# Patient Record
Sex: Female | Born: 1960 | Race: White | Hispanic: No | Marital: Married | Smoking: Current every day smoker
Health system: Southern US, Community
[De-identification: ages and names within clinical notes are randomized; demographics above are authoritative.]

## PROBLEM LIST (undated history)

## (undated) DIAGNOSIS — K219 Gastro-esophageal reflux disease without esophagitis: Secondary | ICD-10-CM

## (undated) DIAGNOSIS — G8929 Other chronic pain: Secondary | ICD-10-CM

## (undated) DIAGNOSIS — E039 Hypothyroidism, unspecified: Secondary | ICD-10-CM

## (undated) DIAGNOSIS — Z86018 Personal history of other benign neoplasm: Secondary | ICD-10-CM

## (undated) DIAGNOSIS — N200 Calculus of kidney: Secondary | ICD-10-CM

## (undated) DIAGNOSIS — N201 Calculus of ureter: Secondary | ICD-10-CM

## (undated) DIAGNOSIS — N950 Postmenopausal bleeding: Secondary | ICD-10-CM

## (undated) DIAGNOSIS — Z8719 Personal history of other diseases of the digestive system: Secondary | ICD-10-CM

## (undated) DIAGNOSIS — Z87442 Personal history of urinary calculi: Secondary | ICD-10-CM

## (undated) DIAGNOSIS — K449 Diaphragmatic hernia without obstruction or gangrene: Secondary | ICD-10-CM

## (undated) DIAGNOSIS — D259 Leiomyoma of uterus, unspecified: Secondary | ICD-10-CM

## (undated) HISTORY — PX: JOINT REPLACEMENT: SHX530

## (undated) HISTORY — PX: TOTAL HIP ARTHROPLASTY: SHX124

## (undated) HISTORY — PX: ABDOMINAL HYSTERECTOMY: SHX81

---

## 1995-12-24 HISTORY — PX: OTHER SURGICAL HISTORY: SHX169

## 1995-12-24 HISTORY — PX: LAPAROTOMY: SHX154

## 1998-09-20 ENCOUNTER — Other Ambulatory Visit: Admission: RE | Admit: 1998-09-20 | Discharge: 1998-09-20 | Payer: Self-pay | Admitting: Obstetrics & Gynecology

## 2001-04-15 ENCOUNTER — Other Ambulatory Visit: Admission: RE | Admit: 2001-04-15 | Discharge: 2001-04-15 | Payer: Self-pay | Admitting: Obstetrics & Gynecology

## 2006-09-04 ENCOUNTER — Observation Stay (HOSPITAL_COMMUNITY): Admission: EM | Admit: 2006-09-04 | Discharge: 2006-09-05 | Payer: Self-pay | Admitting: Emergency Medicine

## 2009-08-31 ENCOUNTER — Encounter: Admission: RE | Admit: 2009-08-31 | Discharge: 2009-08-31 | Payer: Self-pay | Admitting: Obstetrics & Gynecology

## 2009-11-20 ENCOUNTER — Inpatient Hospital Stay (HOSPITAL_COMMUNITY): Admission: RE | Admit: 2009-11-20 | Discharge: 2009-11-22 | Payer: Self-pay | Admitting: Orthopedic Surgery

## 2009-11-20 HISTORY — PX: TOTAL HIP ARTHROPLASTY: SHX124

## 2011-03-26 LAB — BASIC METABOLIC PANEL
BUN: 2 mg/dL — ABNORMAL LOW (ref 6–23)
Calcium: 8.7 mg/dL (ref 8.4–10.5)
Chloride: 105 mEq/L (ref 96–112)
Creatinine, Ser: 0.56 mg/dL (ref 0.4–1.2)
GFR calc non Af Amer: >60 mL/min (ref >60)

## 2011-03-26 LAB — CBC
MCV: 101.6 fL — ABNORMAL HIGH (ref 78.0–100.0)
Platelets: 161 10*3/uL (ref 150–400)
RDW: 12.5 % (ref 11.5–15.5)

## 2011-03-26 LAB — PROTIME-INR: Prothrombin Time: 18 seconds — ABNORMAL HIGH (ref 11.6–15.2)

## 2011-03-27 LAB — BASIC METABOLIC PANEL
BUN: 10 mg/dL (ref 6–23)
Calcium: 8.6 mg/dL (ref 8.4–10.5)
Creatinine, Ser: 0.68 mg/dL (ref 0.4–1.2)
GFR calc Af Amer: >60 mL/min (ref >60)
GFR calc Af Amer: >60 mL/min (ref >60)
GFR calc non Af Amer: >60 mL/min (ref >60)
GFR calc non Af Amer: >60 mL/min (ref >60)
Glucose, Bld: 123 mg/dL — ABNORMAL HIGH (ref 70–99)
Glucose, Bld: 89 mg/dL (ref 70–99)
Potassium: 4.4 mEq/L (ref 3.5–5.1)
Sodium: 135 mEq/L (ref 135–145)

## 2011-03-27 LAB — DIFFERENTIAL
Basophils Absolute: 0.1 10*3/uL (ref 0.0–0.1)
Lymphs Abs: 2.1 10*3/uL (ref 0.7–4.0)
Monocytes Absolute: 0.8 10*3/uL (ref 0.1–1.0)

## 2011-03-27 LAB — CBC
HCT: 42.7 % (ref 36.0–46.0)
Hemoglobin: 12.3 g/dL (ref 12.0–15.0)
Platelets: 204 10*3/uL (ref 150–400)
Platelets: 255 10*3/uL (ref 150–400)
RBC: 3.39 MIL/uL — ABNORMAL LOW (ref 3.87–5.11)
WBC: 10.8 10*3/uL — ABNORMAL HIGH (ref 4.0–10.5)
WBC: 9.6 10*3/uL (ref 4.0–10.5)

## 2011-03-27 LAB — URINALYSIS, ROUTINE W REFLEX MICROSCOPIC
Bilirubin Urine: NEGATIVE
Glucose, UA: NEGATIVE mg/dL
Ketones, ur: NEGATIVE mg/dL
Specific Gravity, Urine: 1.025 (ref 1.005–1.030)

## 2011-03-27 LAB — TYPE AND SCREEN: ABO/RH(D): B POS

## 2011-03-27 LAB — PROTIME-INR
INR: 0.99 (ref 0.00–1.49)
INR: 1.01 (ref 0.00–1.49)
Prothrombin Time: 13.2 seconds (ref 11.6–15.2)

## 2011-03-27 LAB — APTT: aPTT: 24 seconds (ref 24–37)

## 2011-05-10 NOTE — Discharge Summary (Signed)
[name], Kaitlyn Jenkins NO.:  [id number]   MEDICAL RECORD NO.:  [id number]          PATIENT TYPE:  INP   LOCATION:  2019                         FACILITY:  [facility]   PHYSICIAN:  Nicki Guadalajara, M.D.     DATE OF BIRTH:  1961/12/20   DATE OF ADMISSION:  09/04/2006  DATE OF DISCHARGE:  09/05/2006                                 DISCHARGE SUMMARY   DISCHARGE DIAGNOSES:  1. Chest pain.  2. Hypothyroidism.  3. Tobacco abuse.   BRIEF HISTORY:  [name] is a very pleasant 50 year old white female who  presented to [facility] emergency department on September 04, 2006 after she  awakened at 4 a.m. with anterior chest pressure.  The symptoms increased  with movement, walking, bending, twisting, and was improved with rest.  She  states that the pressure radiated from her anterior chest into her neck and  shoulder.  She has not had any associated shortness of breath, nausea, or  diaphoresis.  No lightheadedness, dizziness, syncope, or presyncope.  Please  see dictated history and physical for further details.   HOSPITAL COURSE:  [name] was admitted to [address] with aspirin and beta  blockers.  She ruled out for myocardial infarction with serial cardiac  markers.  Her chest pain has dissipated completely.  Her TSH was mildly  elevated at 6.096.  She is on thyroid replacement.  We have recommended that  she have that rechecked and follow up with her primary care physician if  needed for adjustment of her levothyroxine.  We also had a long discussion  regarding smoking cessation.  She is going to consider this, and we also  discussed Chantix therapy.  She states understanding.  She is stable from a  cardiac standpoint and is deemed ready for discharge.   DISCHARGE INSTRUCTIONS:  She may resume her normal physical activity.  We  recommended that she stop smoking.   DISCHARGE MEDICATIONS:  Include:  1. Aspirin 325 mg daily.  2. __________  daily.  3. Levothyroxine 50 mcg  daily.   We have scheduled her for an outpatient Myoview stress test on September 11, 2006.  She is to be at our office at 8 o'clock in the morning.  She has a  follow up appointment to review the results of that test as well as her  symptoms with Dr. Tresa Endo.  She has been given our office number in case she  has any problems or questions in the meantime she is encouraged to call.     ______________________________  Charmian Muff, NP    ______________________________  Nicki Guadalajara, M.D.    LS/MEDQ  D:  09/05/2006  T:  09/05/2006  Job:  [id number]   cc:   Rodolph Bong, M.D.  [facility]

## 2013-06-22 DIAGNOSIS — N200 Calculus of kidney: Secondary | ICD-10-CM

## 2013-06-22 HISTORY — DX: Calculus of kidney: N20.0

## 2013-06-23 ENCOUNTER — Encounter (HOSPITAL_COMMUNITY): Payer: Self-pay | Admitting: Emergency Medicine

## 2013-06-23 ENCOUNTER — Emergency Department (HOSPITAL_COMMUNITY): Payer: 59

## 2013-06-23 ENCOUNTER — Emergency Department (HOSPITAL_COMMUNITY)
Admission: EM | Admit: 2013-06-23 | Discharge: 2013-06-23 | Disposition: A | Discharge status: Home / Self Care | Payer: 59 | Attending: Emergency Medicine | Admitting: Emergency Medicine

## 2013-06-23 DIAGNOSIS — R112 Nausea with vomiting, unspecified: Secondary | ICD-10-CM | POA: Insufficient documentation

## 2013-06-23 DIAGNOSIS — N201 Calculus of ureter: Secondary | ICD-10-CM | POA: Insufficient documentation

## 2013-06-23 DIAGNOSIS — Z79899 Other long term (current) drug therapy: Secondary | ICD-10-CM | POA: Insufficient documentation

## 2013-06-23 DIAGNOSIS — F17200 Nicotine dependence, unspecified, uncomplicated: Secondary | ICD-10-CM | POA: Insufficient documentation

## 2013-06-23 DIAGNOSIS — N23 Unspecified renal colic: Secondary | ICD-10-CM | POA: Insufficient documentation

## 2013-06-23 LAB — CBC WITH DIFFERENTIAL/PLATELET
Basophils Relative: 1 % (ref 0–1)
Hemoglobin: 16.8 g/dL — ABNORMAL HIGH (ref 12.0–15.0)
MCV: 104 fL — ABNORMAL HIGH (ref 78.0–100.0)
Monocytes Absolute: 0.7 10*3/uL (ref 0.1–1.0)
Neutrophils Relative %: 57 % (ref 43–77)
RBC: 4.45 MIL/uL (ref 3.87–5.11)
WBC: 5.9 10*3/uL (ref 4.0–10.5)

## 2013-06-23 LAB — URINALYSIS, ROUTINE W REFLEX MICROSCOPIC
Bilirubin Urine: NEGATIVE
Glucose, UA: NEGATIVE mg/dL
Leukocytes, UA: NEGATIVE
Protein, ur: NEGATIVE mg/dL
Urobilinogen, UA: 0.2 mg/dL (ref 0.0–1.0)
pH: 5.5 (ref 5.0–8.0)

## 2013-06-23 LAB — COMPREHENSIVE METABOLIC PANEL
AST: 83 U/L — ABNORMAL HIGH (ref 0–37)
Alkaline Phosphatase: 102 U/L (ref 39–117)
BUN: 7 mg/dL (ref 6–23)
CO2: 23 mEq/L (ref 19–32)
Glucose, Bld: 104 mg/dL — ABNORMAL HIGH (ref 70–99)
Sodium: 144 mEq/L (ref 135–145)
Total Protein: 7.1 g/dL (ref 6.0–8.3)

## 2013-06-23 LAB — URINE MICROSCOPIC-ADD ON

## 2013-06-23 MED ORDER — ONDANSETRON HCL 4 MG PO TABS: 4.0000 mg | ORAL_TABLET | Freq: Four times a day (QID) | ORAL | Status: DC

## 2013-06-23 MED ORDER — ONDANSETRON HCL 4 MG/2ML IJ SOLN: 4.0000 mg | INTRAMUSCULAR | Status: DC | PRN

## 2013-06-23 MED ORDER — KETOROLAC TROMETHAMINE 30 MG/ML IJ SOLN
30.0000 mg | Freq: Once | INTRAMUSCULAR | Status: AC
  Administered 2013-06-23: 30 mg via INTRAVENOUS
  Filled 2013-06-23: qty 1

## 2013-06-23 MED ORDER — SODIUM CHLORIDE 0.9 % IV SOLN
INTRAVENOUS | Status: DC
  Administered 2013-06-23: 06:00:00 via INTRAVENOUS

## 2013-06-23 MED ORDER — OXYCODONE-ACETAMINOPHEN 5-325 MG PO TABS: 1.0000 | ORAL_TABLET | Freq: Four times a day (QID) | ORAL | Status: DC | PRN

## 2013-06-23 MED ORDER — FENTANYL CITRATE 0.05 MG/ML IJ SOLN: 50.0000 ug | Freq: Once | INTRAMUSCULAR | Status: AC

## 2013-06-23 MED ORDER — FENTANYL CITRATE 0.05 MG/ML IJ SOLN
INTRAMUSCULAR | Status: AC
  Administered 2013-06-23: 50 ug via INTRAVENOUS
  Filled 2013-06-23: qty 2

## 2013-06-23 MED ORDER — ONDANSETRON HCL 4 MG/2ML IJ SOLN
4.0000 mg | Freq: Once | INTRAMUSCULAR | Status: AC
  Administered 2013-06-23: 4 mg via INTRAVENOUS
  Filled 2013-06-23: qty 2

## 2013-06-23 MED ORDER — TAMSULOSIN HCL 0.4 MG PO CAPS: 0.4000 mg | ORAL_CAPSULE | Freq: Every day | ORAL | Status: DC

## 2013-06-23 MED ORDER — HYDROMORPHONE HCL PF 1 MG/ML IJ SOLN
1.0000 mg | Freq: Once | INTRAMUSCULAR | Status: AC
  Administered 2013-06-23: 1 mg via INTRAVENOUS
  Filled 2013-06-23: qty 1

## 2013-06-23 MED ORDER — MORPHINE SULFATE 4 MG/ML IJ SOLN
4.0000 mg | INTRAMUSCULAR | Status: DC | PRN
  Administered 2013-06-23: 4 mg via INTRAVENOUS
  Filled 2013-06-23: qty 1

## 2013-06-23 NOTE — ED Notes (Signed)
PT. REPORTS RIGHT LATERAL ABDOMINAL PAIN WITH VOMITTING THIS MORNING , DENIES DIARRHEA/FEVER OR CHILLS. NO URINARY SYMPTOMS.

## 2013-06-23 NOTE — ED Notes (Signed)
Pt and husband stated sudden of right abdominal pain started at 0230 woken up from sleep with the pain, rating it 10/10 and radiating now to back.

## 2013-06-23 NOTE — ED Notes (Signed)
Pt discharged.Vital signs stable and GCS 15 

## 2013-06-23 NOTE — ED Notes (Signed)
MD at bedside. 

## 2013-06-23 NOTE — ED Provider Notes (Signed)
History    CSN: [id number] Arrival date & time 06/23/13  0435  First MD Initiated Contact with Patient 06/23/13 0522     Chief Complaint  Patient presents with  . Abdominal Pain    HPI Pt was seen at 0520.  Per pt, c/o sudden onset and persistence of waxing and waning right sided flank "pain" that began this morning approximately 0230 PTA.  Pt describes the pain as "sharp," and radiating into the right side of her abd.  Has been associated with multiple intermittent episodes of N/V.  Denies dysuria/hematuria, no abd pain, no diarrhea, no black or blood in emesis, no CP/SOB, no fevers, no rash.    History reviewed. No pertinent past medical history.  Past Surgical History  Procedure Laterality Date  . Total hip arthroplasty      History  Substance Use Topics  . Smoking status: Current Every Day Smoker  . Smokeless tobacco: Not on file  . Alcohol Use: Yes    Review of Systems ROS: Statement: All systems negative except as marked or noted in the HPI; Constitutional: Negative for fever and chills. ; ; Eyes: Negative for eye pain, redness and discharge. ; ; ENMT: Negative for ear pain, hoarseness, nasal congestion, sinus pressure and sore throat. ; ; Cardiovascular: Negative for chest pain, palpitations, diaphoresis, dyspnea and peripheral edema. ; ; Respiratory: Negative for cough, wheezing and stridor. ; ; Gastrointestinal: +N/V. Negative for diarrhea, abdominal pain, blood in stool, hematemesis, jaundice and rectal bleeding. . ; ; Genitourinary: +flank pain. Negative for dysuria and hematuria. ; ; Musculoskeletal: Negative for back pain and neck pain. Negative for swelling and trauma.; ; Skin: Negative for pruritus, rash, abrasions, blisters, bruising and skin lesion.; ; Neuro: Negative for headache, lightheadedness and neck stiffness. Negative for weakness, altered level of consciousness , altered mental status, extremity weakness, paresthesias, involuntary movement, seizure and  syncope.     Allergies  Review of patient's allergies indicates no known allergies.  Home Medications   Current Outpatient Rx  Name  Route  Sig  Dispense  Refill  . CALCIUM PO   Oral   Take 1 tablet by mouth daily.         . Cholecalciferol (VITAMIN D PO)   Oral   Take 1 tablet by mouth daily.         Marland Kitchen esomeprazole (NEXIUM) 40 MG capsule   Oral   Take 40 mg by mouth daily before breakfast.         . levothyroxine (SYNTHROID, LEVOTHROID) 100 MCG tablet   Oral   Take 100 mcg by mouth daily before breakfast.         . Multiple Vitamin (MULTIVITAMIN WITH MINERALS) TABS   Oral   Take 1 tablet by mouth daily.         . Omega-3 Fatty Acids (OMEGA 3 PO)   Oral   Take 1 tablet by mouth daily.         . phentermine (ADIPEX-P) 37.5 MG tablet   Oral   Take 37.5 mg by mouth daily before breakfast.          BP 147/78  Pulse 72  Temp(Src) 97.9 F (36.6 C) (Oral)  Resp 20  SpO2 99%  Physical Exam 0525: Physical examination:  Nursing notes reviewed; Vital signs and O2 SAT reviewed;  Constitutional: Well developed, Well nourished, Well hydrated, Uncomfortable appearing; Head:  Normocephalic, atraumatic; Eyes: EOMI, PERRL, No scleral icterus; ENMT: Mouth and pharynx normal, Mucous membranes moist;  Neck: Supple, Full range of motion, No lymphadenopathy; Cardiovascular: Regular rate and rhythm, No gallop; Respiratory: Breath sounds clear & equal bilaterally, No rales, rhonchi, wheezes.  Speaking full sentences with ease, Normal respiratory effort/excursion; Chest: Nontender, Movement normal; Abdomen: Soft, Nontender, Nondistended, Normal bowel sounds; Genitourinary: No CVA tenderness; Spine:  No midline CS, TS, LS tenderness.;; Extremities: Pulses normal, No tenderness, No edema, No calf edema or asymmetry.; Neuro: AA&Ox3, Major CN grossly intact.  Speech clear. No gross focal motor or sensory deficits in extremities.; Skin: Color normal, Warm, Dry.   ED Course   Procedures     MDM  MDM Reviewed: previous chart, nursing note and vitals Reviewed previous: labs Interpretation: labs and CT scan   Results for orders placed during the hospital encounter of 06/23/13  URINALYSIS, ROUTINE W REFLEX MICROSCOPIC      Result Value Range   Color, Urine YELLOW  YELLOW   APPearance CLOUDY (*) CLEAR   Specific Gravity, Urine 1.019  1.005 - 1.030   pH 5.5  5.0 - 8.0   Glucose, UA NEGATIVE  NEGATIVE mg/dL   Hgb urine dipstick LARGE (*) NEGATIVE   Bilirubin Urine NEGATIVE  NEGATIVE   Ketones, ur NEGATIVE  NEGATIVE mg/dL   Protein, ur NEGATIVE  NEGATIVE mg/dL   Urobilinogen, UA 0.2  0.0 - 1.0 mg/dL   Nitrite NEGATIVE  NEGATIVE   Leukocytes, UA NEGATIVE  NEGATIVE  CBC WITH DIFFERENTIAL      Result Value Range   WBC 5.9  4.0 - 10.5 K/uL   RBC 4.45  3.87 - 5.11 MIL/uL   Hemoglobin 16.8 (*) 12.0 - 15.0 g/dL   HCT 16.1 (*) 09.6 - 04.5 %   MCV 104.0 (*) 78.0 - 100.0 fL   MCH 37.8 (*) 26.0 - 34.0 pg   MCHC 36.3 (*) 30.0 - 36.0 g/dL   RDW 40.9  81.1 - 91.4 %   Platelets 192  150 - 400 K/uL   Neutrophils Relative % 57  43 - 77 %   Neutro Abs 3.4  1.7 - 7.7 K/uL   Lymphocytes Relative 29  12 - 46 %   Lymphs Abs 1.7  0.7 - 4.0 K/uL   Monocytes Relative 11  3 - 12 %   Monocytes Absolute 0.7  0.1 - 1.0 K/uL   Eosinophils Relative 2  0 - 5 %   Eosinophils Absolute 0.1  0.0 - 0.7 K/uL   Basophils Relative 1  0 - 1 %   Basophils Absolute 0.0  0.0 - 0.1 K/uL  COMPREHENSIVE METABOLIC PANEL      Result Value Range   Sodium 144  135 - 145 mEq/L   Potassium 3.6  3.5 - 5.1 mEq/L   Chloride 105  96 - 112 mEq/L   CO2 23  19 - 32 mEq/L   Glucose, Bld 104 (*) 70 - 99 mg/dL   BUN 7  6 - 23 mg/dL   Creatinine, Ser 7.82  0.50 - 1.10 mg/dL   Calcium 9.5  8.4 - 95.6 mg/dL   Total Protein 7.1  6.0 - 8.3 g/dL   Albumin 3.6  3.5 - 5.2 g/dL   AST 83 (*) 0 - 37 U/L   ALT 104 (*) 0 - 35 U/L   Alkaline Phosphatase 102  39 - 117 U/L   Total Bilirubin 0.7  0.3 - 1.2  mg/dL   GFR calc non Af Amer >90  >90 mL/min   GFR calc Af Amer >90  >90 mL/min  URINE MICROSCOPIC-ADD ON      Result Value Range   Squamous Epithelial / LPF MANY (*) RARE   WBC, UA 0-2  <3 WBC/hpf   RBC / HPF 7-10  <3 RBC/hpf   Bacteria, UA RARE  RARE  LIPASE, BLOOD      Result Value Range   Lipase 35  11 - 59 U/L   Ct Abdomen Pelvis Wo Contrast 06/23/2013   *RADIOLOGY REPORT*  Clinical Data: Right-sided abdominal pain radiating to the back.  CT ABDOMEN AND PELVIS WITHOUT CONTRAST  Technique:  Multidetector CT imaging of the abdomen and pelvis was performed following the standard protocol without intravenous contrast.  Comparison: None.  Findings: Lung Bases:  Dependent atelectasis.  Liver:  Fatty liver.  There is a focal area of increased attenuation in the left hepatic lobe measuring 2 cm (image number 19 series 2). This may represent an area of focal fatty sparing. No other abnormalities.  Spleen:  Normal.  Gallbladder:  Normal.  No calcified stones.  Common bile duct:  Normal.  Pancreas:  Normal.  Adrenal glands:  Right adrenal adenomas and thickening of the left adrenal gland without discrete lesion.  Kidneys:  Nonobstructing left upper pole renal collecting system calculus measuring 6 mm long axis.  Mild to moderate right hydroureteronephrosis.  There is a right UPJ stone measuring 8 mm by 5 mm.  Smaller nonobstructing collecting system calculi are present in the right inferior renal pole.  Mild right perinephric and proximal periureteric stranding.  The distal third of the right ureter is obscured by artifact from right hip arthroplasty.  Left ureter appears within normal limits.  Stomach:  Grossly normal.  Small bowel:  Grossly normal.  Colon:   Normal appendix.  No inflammatory changes colon.  Distal colon decompressed.  Pelvic Genitourinary:  Visualized portions appear within normal limits.  The pelvis partially obscured by artifact from hip arthroplasty.  Bones:  No aggressive osseous  lesions.  Chronic-appearing L2 superior endplate compression fracture with mild retropulsion and associated disc osteophyte complex.  Vasculature: Atherosclerosis without acute vascular abnormality.  Body Wall: Normal.  IMPRESSION:  1.  8 mm x 5 mm right UPJ stone with mild to moderate right hydronephrosis. 2.  Fatty liver.  Higher attenuation 2 cm lesion in the anterior left hepatic lobe may represent an area of focal fatty sparing. Follow up liver MRI recommended for further assessment. 3.  Right adrenal adenomas. 4.  Bilateral residual nonobstructing renal collecting system calculi.   Original Report Authenticated By: Andreas Newport, M.D.    Meryl.Garbe:  +ureteral calculi on CT scan. Pt's pain intermittently controlled with meds, then returns. LFT's mildly elevated, no old to compare. No UTI and BUN/Cr normal. Will re-dose pain meds.  Sign out to Dr. Anitra Lauth.   Laray Anger, DO 06/23/13 [phone]

## 2013-06-23 NOTE — ED Provider Notes (Signed)
Patient's pain is resolved after Dilaudid and Toradol. We'll discharge patient home with Flomax, Percocet and Zofran. She was given number for urology for followup in given strict return precautions.  Gwyneth Sprout, MD 06/23/13 [phone]

## 2013-06-28 ENCOUNTER — Other Ambulatory Visit: Payer: Self-pay | Admitting: Urology

## 2013-06-29 ENCOUNTER — Encounter (HOSPITAL_COMMUNITY): Payer: Self-pay | Admitting: *Deleted

## 2013-06-30 ENCOUNTER — Encounter (HOSPITAL_COMMUNITY): Payer: Self-pay | Admitting: Pharmacy Technician

## 2013-07-01 ENCOUNTER — Ambulatory Visit (HOSPITAL_COMMUNITY)
Admission: RE | Admit: 2013-07-01 | Discharge: 2013-07-01 | Disposition: A | Discharge status: Home / Self Care | Payer: 59 | Source: Ambulatory Visit | Attending: Urology | Admitting: Urology

## 2013-07-01 ENCOUNTER — Encounter (HOSPITAL_COMMUNITY): Payer: Self-pay | Admitting: *Deleted

## 2013-07-01 ENCOUNTER — Ambulatory Visit (HOSPITAL_COMMUNITY): Payer: 59

## 2013-07-01 ENCOUNTER — Encounter (HOSPITAL_COMMUNITY)
Admission: RE | Disposition: A | Discharge status: Home / Self Care | Payer: Self-pay | Source: Ambulatory Visit | Attending: Urology

## 2013-07-01 DIAGNOSIS — N200 Calculus of kidney: Secondary | ICD-10-CM | POA: Insufficient documentation

## 2013-07-01 DIAGNOSIS — K219 Gastro-esophageal reflux disease without esophagitis: Secondary | ICD-10-CM | POA: Insufficient documentation

## 2013-07-01 DIAGNOSIS — N201 Calculus of ureter: Secondary | ICD-10-CM

## 2013-07-01 DIAGNOSIS — E039 Hypothyroidism, unspecified: Secondary | ICD-10-CM | POA: Insufficient documentation

## 2013-07-01 DIAGNOSIS — N1330 Unspecified hydronephrosis: Secondary | ICD-10-CM | POA: Insufficient documentation

## 2013-07-01 HISTORY — DX: Calculus of kidney: N20.0

## 2013-07-01 HISTORY — DX: Gastro-esophageal reflux disease without esophagitis: K21.9

## 2013-07-01 HISTORY — DX: Hypothyroidism, unspecified: E03.9

## 2013-07-01 HISTORY — DX: Personal history of other diseases of the digestive system: Z87.19

## 2013-07-01 SURGERY — LITHOTRIPSY, ESWL
Anesthesia: LOCAL | Laterality: Right

## 2013-07-01 MED ORDER — DEXTROSE-NACL 5-0.45 % IV SOLN
INTRAVENOUS | Status: DC
  Administered 2013-07-01: 12:00:00 via INTRAVENOUS

## 2013-07-01 MED ORDER — OXYCODONE-ACETAMINOPHEN 5-325 MG PO TABS: 1.0000 | ORAL_TABLET | ORAL | Status: DC | PRN

## 2013-07-01 MED ORDER — ONDANSETRON HCL 4 MG/2ML IJ SOLN: 4.0000 mg | INTRAMUSCULAR | Status: DC | PRN

## 2013-07-01 MED ORDER — CIPROFLOXACIN HCL 500 MG PO TABS
500.0000 mg | ORAL_TABLET | ORAL | Status: AC
  Administered 2013-07-01: 500 mg via ORAL
  Filled 2013-07-01: qty 1

## 2013-07-01 MED ORDER — DIAZEPAM 5 MG PO TABS
10.0000 mg | ORAL_TABLET | ORAL | Status: AC
  Administered 2013-07-01: 10 mg via ORAL
  Filled 2013-07-01: qty 2

## 2013-07-01 MED ORDER — DIPHENHYDRAMINE HCL 25 MG PO CAPS
25.0000 mg | ORAL_CAPSULE | ORAL | Status: AC
  Administered 2013-07-01: 25 mg via ORAL
  Filled 2013-07-01: qty 1

## 2013-07-01 MED ORDER — SENNOSIDES-DOCUSATE SODIUM 8.6-50 MG PO TABS: 1.0000 | ORAL_TABLET | Freq: Two times a day (BID) | ORAL | Status: DC

## 2013-07-01 MED ORDER — MORPHINE SULFATE 10 MG/ML IJ SOLN: 2.0000 mg | INTRAMUSCULAR | Status: DC | PRN

## 2013-07-01 NOTE — Brief Op Note (Signed)
07/01/2013  1:36 PM  PATIENT:  Kaitlyn Jenkins  52 y.o. female  PRE-OPERATIVE DIAGNOSIS:  RIGHT URETEROPELVIC JUNCTION STONE  POST-OPERATIVE DIAGNOSIS:  Same  PROCEDURE:  Procedure(s): RIGHT EXTRACORPOREAL SHOCK WAVE LITHOTRIPSY (ESWL) (Right)  SURGEON:  Surgeon(s) and Role:    * Milford Cage, MD - Primary  PHYSICIAN ASSISTANT:   ASSISTANTS: none   ANESTHESIA:   IV sedation  EBL:   N/a  BLOOD ADMINISTERED:none  DRAINS: none   LOCAL MEDICATIONS USED:  NONE  FINDINGS: Small 1.5 cm superficial abrasion/hematoma on right flank where skin was coupled to SWL device.  SPECIMEN:  No Specimen  DISPOSITION OF SPECIMEN:  N/A  COUNTS:  YES  TOURNIQUET:  * No tourniquets in log *  DICTATION: .Note written in paper chart  PLAN OF CARE: Discharge to home after PACU  PATIENT DISPOSITION:  PACU - hemodynamically stable.   Delay start of Pharmacological VTE agent (>24hrs) due to surgical blood loss or risk of bleeding: yes

## 2013-07-01 NOTE — H&P (Signed)
Urology History and Physical Exam  CC: Right ureteropelvic junction stone.  HPI:  52 year old female today presents for a right ureter stone. This is her first stone event. She began having symptoms on 06/23/13. A CT scan on that same day revealed a right-sided ureteropelvic junction stone. It measured 8 x 5 mm in size. It was associated with proximal hydronephrosis. There was also nephrolithiasis bilaterally. It is not associated with fever. She has had emesis and nausea. This was able to be controlled with oral medications. She had a KUB on 06/24/13 in which the right proximal ureter stone was visible near the transverse process on the right side of L2. After reviewing options she has elected for shockwave lithotripsy appeared she presents today for that procedure. We have discussed the risks, benefits, side effects and alternatives. UA 06/24/13 was negative for signs of infection.  PMH: Past Medical History  Diagnosis Date  . Hypothyroidism   . Renal stone 06/2013  . GERD (gastroesophageal reflux disease)   . H/O hiatal hernia     PSH: Past Surgical History  Procedure Laterality Date  . Total hip arthroplasty      Allergies: No Known Allergies  Medications: No prescriptions prior to admission     Social History: History   Social History  . Marital Status: Married    Spouse Name: N/A    Number of Children: N/A  . Years of Education: N/A   Occupational History  . Not on file.   Social History Main Topics  . Smoking status: Current Every Day Smoker -- 1.00 packs/day for 38 years    Types: Cigarettes  . Smokeless tobacco: Not on file  . Alcohol Use: 2.4 oz/week    4 Cans of beer per week     Comment: 4/week  . Drug Use: No  . Sexually Active: Not on file   Other Topics Concern  . Not on file   Social History Narrative  . No narrative on file    Family History: History reviewed. No pertinent family history.  Review of Systems: Positive: Right flank  pain. Negative: Fever, chest pain, or SOB.  A further 10 point review of systems was negative except what is listed in the HPI.  Physical Exam: Filed Vitals:   07/01/13 1030  BP: 152/76  Pulse: 91  Temp: 98.5 F (36.9 C)  Resp: 18    General: No acute distress.  Awake. Head:  Normocephalic.  Atraumatic. ENT:  EOMI.  Mucous membranes moist Neck:  Supple.  No lymphadenopathy. CV:  S1 present. S2 present. Regular rate. Pulmonary: Equal effort bilaterally.  Clear to auscultation bilaterally. Abdomen: Soft.  Non- tender to palpation. Skin:  Normal turgor.  No visible rash. Extremity: No gross deformity of bilateral upper extremities.  No gross deformity of    bilateral lower extremities. Neurologic: Alert. Appropriate mood.    Studies:  No results found for this basename: HGB, WBC, PLT,  in the last 72 hours  No results found for this basename: NA, K, CL, CO2, BUN, CREATININE, CALCIUM, MAGNESIUM, GFRNONAA, GFRAA,  in the last 72 hours   No results found for this basename: PT, INR, APTT,  in the last 72 hours   No components found with this basename: ABG,     Assessment:  Right ureteropelvic junction stone.  Plan: Proceed with shockwave lithotripsy of the right ureteropelvic junction stone.

## 2013-07-02 HISTORY — PX: EXTRACORPOREAL SHOCK WAVE LITHOTRIPSY: SHX1557

## 2014-08-09 ENCOUNTER — Encounter (HOSPITAL_COMMUNITY): Payer: Self-pay | Admitting: Emergency Medicine

## 2014-08-09 ENCOUNTER — Emergency Department (HOSPITAL_COMMUNITY): Payer: 59

## 2014-08-09 ENCOUNTER — Emergency Department (HOSPITAL_COMMUNITY)
Admission: EM | Admit: 2014-08-09 | Discharge: 2014-08-09 | Discharge status: Against Medical Advice | Payer: 59 | Attending: Emergency Medicine | Admitting: Emergency Medicine

## 2014-08-09 DIAGNOSIS — Z87442 Personal history of urinary calculi: Secondary | ICD-10-CM | POA: Insufficient documentation

## 2014-08-09 DIAGNOSIS — Z79899 Other long term (current) drug therapy: Secondary | ICD-10-CM | POA: Insufficient documentation

## 2014-08-09 DIAGNOSIS — F17200 Nicotine dependence, unspecified, uncomplicated: Secondary | ICD-10-CM | POA: Diagnosis not present

## 2014-08-09 DIAGNOSIS — E039 Hypothyroidism, unspecified: Secondary | ICD-10-CM | POA: Insufficient documentation

## 2014-08-09 DIAGNOSIS — M549 Dorsalgia, unspecified: Secondary | ICD-10-CM | POA: Insufficient documentation

## 2014-08-09 DIAGNOSIS — R079 Chest pain, unspecified: Secondary | ICD-10-CM | POA: Insufficient documentation

## 2014-08-09 DIAGNOSIS — R1013 Epigastric pain: Secondary | ICD-10-CM | POA: Insufficient documentation

## 2014-08-09 DIAGNOSIS — K219 Gastro-esophageal reflux disease without esophagitis: Secondary | ICD-10-CM | POA: Insufficient documentation

## 2014-08-09 LAB — CBC
HCT: 41 % (ref 36.0–46.0)
Hemoglobin: 14.5 g/dL (ref 12.0–15.0)
MCH: 36.8 pg — ABNORMAL HIGH (ref 26.0–34.0)
MCHC: 35.4 g/dL (ref 30.0–36.0)
MCV: 104.1 fL — AB (ref 78.0–100.0)
PLATELETS: 201 10*3/uL (ref 150–400)
RBC: 3.94 MIL/uL (ref 3.87–5.11)
RDW: 12.2 % (ref 11.5–15.5)
WBC: 6.1 10*3/uL (ref 4.0–10.5)

## 2014-08-09 LAB — COMPREHENSIVE METABOLIC PANEL
ALBUMIN: 3.9 g/dL (ref 3.5–5.2)
ALK PHOS: 111 U/L (ref 39–117)
ALT: 85 U/L — ABNORMAL HIGH (ref 0–35)
AST: 66 U/L — ABNORMAL HIGH (ref 0–37)
Anion gap: 15 (ref 5–15)
BUN: 7 mg/dL (ref 6–23)
CO2: 23 mEq/L (ref 19–32)
Calcium: 9.7 mg/dL (ref 8.4–10.5)
Chloride: 101 mEq/L (ref 96–112)
Creatinine, Ser: 0.58 mg/dL (ref 0.50–1.10)
GFR calc Af Amer: >90 mL/min (ref >90)
GFR calc non Af Amer: >90 mL/min (ref >90)
GLUCOSE: 116 mg/dL — AB (ref 70–99)
POTASSIUM: 3.7 meq/L (ref 3.7–5.3)
Sodium: 139 mEq/L (ref 137–147)
TOTAL PROTEIN: 7.1 g/dL (ref 6.0–8.3)
Total Bilirubin: 0.5 mg/dL (ref 0.3–1.2)

## 2014-08-09 LAB — I-STAT TROPONIN, ED: TROPONIN I, POC: 0 ng/mL (ref 0.00–0.08)

## 2014-08-09 NOTE — ED Provider Notes (Signed)
CSN: [id number]     Arrival date & time 08/09/14  1629 History   First MD Initiated Contact with Patient 08/09/14 1656     Chief Complaint  Patient presents with  . Chest Pain  . Back Pain     (Consider location/radiation/quality/duration/timing/severity/associated sxs/prior Treatment) Patient is a 53 y.o. female presenting with chest pain and back pain. The history is provided by the patient.  Chest Pain Pain location:  Substernal area Pain quality: pressure   Pain radiates to:  Upper back Pain radiates to the back: no   Pain severity:  Moderate Onset quality:  Sudden Duration:  1 hour Timing:  Intermittent Progression:  Resolved Chronicity:  New Context: at rest   Relieved by:  Nothing Worsened by:  Nothing tried Associated symptoms: back pain   Associated symptoms: no abdominal pain, no cough, no fever, no shortness of breath and not vomiting   Back Pain Associated symptoms: no abdominal pain, no chest pain and no fever     Past Medical History  Diagnosis Date  . Hypothyroidism   . Renal stone 06/2013  . GERD (gastroesophageal reflux disease)   . H/O hiatal hernia    Past Surgical History  Procedure Laterality Date  . Total hip arthroplasty     No family history on file. History  Substance Use Topics  . Smoking status: Current Every Day Smoker -- 1.00 packs/day for 38 years    Types: Cigarettes  . Smokeless tobacco: Not on file  . Alcohol Use: 2.4 oz/week    4 Cans of beer per week     Comment: 4/week   OB History   Grav Para Term Preterm Abortions TAB SAB Ect Mult Living                 Review of Systems  Constitutional: Negative for fever and chills.  Respiratory: Negative for cough and shortness of breath.   Cardiovascular: Negative for chest pain and leg swelling.  Gastrointestinal: Negative for vomiting and abdominal pain.  Musculoskeletal: Positive for back pain.  All other systems reviewed and are negative.     Allergies  Review of  patient's allergies indicates no known allergies.  Home Medications   Prior to Admission medications   Medication Sig Start Date End Date Taking? Authorizing Provider  CALCIUM PO Take 1 tablet by mouth daily.   Yes Historical Provider, MD  esomeprazole (NEXIUM 24HR) 20 MG capsule Take 20 mg by mouth every morning.   Yes Historical Provider, MD  levothyroxine (SYNTHROID, LEVOTHROID) 100 MCG tablet Take 100 mcg by mouth daily before breakfast.   Yes Historical Provider, MD  Multiple Vitamin (MULTIVITAMIN WITH MINERALS) TABS tablet Take 1 tablet by mouth daily.   Yes Historical Provider, MD  Omega-3 Fatty Acids (FISH OIL PO) Take 1 capsule by mouth daily.   Yes Historical Provider, MD  phentermine (ADIPEX-P) 37.5 MG tablet Take 37.5 mg by mouth daily before breakfast.   Yes Historical Provider, MD   BP 159/81  Pulse 72  Temp(Src) 98.3 F (36.8 C) (Oral)  Resp 14  Wt 136 lb (61.689 kg)  SpO2 98% Physical Exam  Nursing note and vitals reviewed. Constitutional: She is oriented to person, place, and time. She appears well-developed and well-nourished. No distress.  HENT:  Head: Normocephalic and atraumatic.  Mouth/Throat: Oropharynx is clear and moist.  Eyes: EOM are normal. Pupils are equal, round, and reactive to light.  Neck: Normal range of motion. Neck supple.  Cardiovascular: Normal rate and regular  rhythm.  Exam reveals no friction rub.   No murmur heard. Pulmonary/Chest: Effort normal and breath sounds normal. No respiratory distress. She has no wheezes. She has no rales.  Abdominal: Soft. She exhibits no distension. There is no tenderness. There is no rebound.  Musculoskeletal: Normal range of motion. She exhibits no edema.  Neurological: She is alert and oriented to person, place, and time.  Skin: No rash noted. She is not diaphoretic.    ED Course  Procedures (including critical care time) Labs Review Labs Reviewed  CBC  COMPREHENSIVE METABOLIC PANEL  I-STAT [place],  ED    Imaging Review No results found.   EKG Interpretation   Date/Time:  Tuesday August 09 2014 16:34:22 EDT Ventricular Rate:  72 PR Interval:  229 QRS Duration: 76 QT Interval:  371 QTC Calculation: 406 R Axis:   71 Text Interpretation:  Sinus rhythm Ventricular premature complex Prolonged  PR interval Baseline wander in lead(s) V2 Similar to prior Confirmed by  Mingo Amber  MD, Rudolf Blizard ([id number]) on 08/09/2014 5:04:11 PM      MDM   Final diagnoses:  Epigastric pain    53 year old female comes in with epigastric pain and pressure behind her breast that radiates to her upper back. Began around 3:00, intermittent in 3 or 4 minute waves, resolved one hour ago. No history of cardiac disease. This has history of reflux, and is not feel similar to this. Had not just eaten. No associated nausea, vomiting, shortness of breath, fever, cough. Here pain free, icing comfortably. Vitals are stable. No epigastric or right upper quadrant pain on exam. No need for abdominal imaging was no pain at this time. Concern for possible biliary colic versus ACS. Will check basic labs, chest x-ray. EKG is normal. Patient wants to leave after initial labs normal. She is aware this is not a complete workup and is comfortable taking the risk to leave. I explained need for 2nd troponin. Signed AMA. Easily ambulatory on discharge.   Evelina Bucy, MD 08/10/14 [phone]

## 2014-08-09 NOTE — ED Notes (Signed)
Pt c/o epigastric pain, pressure behind breasts that radiates to back. Pt states started about 1430. Denies n/v, headache or dizziness.

## 2014-12-23 HISTORY — PX: EXTRACORPOREAL SHOCK WAVE LITHOTRIPSY: SHX1557

## 2018-03-20 ENCOUNTER — Encounter (HOSPITAL_COMMUNITY): Payer: Self-pay | Admitting: *Deleted

## 2018-03-20 ENCOUNTER — Other Ambulatory Visit: Payer: Self-pay

## 2018-03-20 ENCOUNTER — Emergency Department (HOSPITAL_COMMUNITY)
Admission: EM | Admit: 2018-03-20 | Discharge: 2018-03-20 | Disposition: A | Discharge status: Home / Self Care | Payer: 59 | Attending: Physician Assistant | Admitting: Physician Assistant

## 2018-03-20 ENCOUNTER — Emergency Department (HOSPITAL_COMMUNITY): Payer: 59

## 2018-03-20 DIAGNOSIS — Z96649 Presence of unspecified artificial hip joint: Secondary | ICD-10-CM | POA: Insufficient documentation

## 2018-03-20 DIAGNOSIS — W010XXA Fall on same level from slipping, tripping and stumbling without subsequent striking against object, initial encounter: Secondary | ICD-10-CM | POA: Insufficient documentation

## 2018-03-20 DIAGNOSIS — S299XXA Unspecified injury of thorax, initial encounter: Secondary | ICD-10-CM | POA: Diagnosis present

## 2018-03-20 DIAGNOSIS — E039 Hypothyroidism, unspecified: Secondary | ICD-10-CM | POA: Insufficient documentation

## 2018-03-20 DIAGNOSIS — Z79899 Other long term (current) drug therapy: Secondary | ICD-10-CM | POA: Diagnosis not present

## 2018-03-20 DIAGNOSIS — F17210 Nicotine dependence, cigarettes, uncomplicated: Secondary | ICD-10-CM | POA: Diagnosis not present

## 2018-03-20 DIAGNOSIS — Y998 Other external cause status: Secondary | ICD-10-CM | POA: Insufficient documentation

## 2018-03-20 DIAGNOSIS — Y9389 Activity, other specified: Secondary | ICD-10-CM | POA: Diagnosis not present

## 2018-03-20 DIAGNOSIS — S2242XA Multiple fractures of ribs, left side, initial encounter for closed fracture: Secondary | ICD-10-CM | POA: Diagnosis not present

## 2018-03-20 DIAGNOSIS — Y929 Unspecified place or not applicable: Secondary | ICD-10-CM | POA: Insufficient documentation

## 2018-03-20 MED ORDER — OXYCODONE-ACETAMINOPHEN 5-325 MG PO TABS: 1.0000 | ORAL_TABLET | Freq: Four times a day (QID) | ORAL | 0 refills | Status: DC | PRN

## 2018-03-20 MED ORDER — OXYCODONE-ACETAMINOPHEN 5-325 MG PO TABS
1.0000 | ORAL_TABLET | Freq: Once | ORAL | Status: AC
  Administered 2018-03-20: 1 via ORAL
  Filled 2018-03-20: qty 1

## 2018-03-20 MED ORDER — IBUPROFEN 800 MG PO TABS: 800.0000 mg | ORAL_TABLET | Freq: Three times a day (TID) | ORAL | 0 refills | Status: DC

## 2018-03-20 NOTE — Discharge Instructions (Signed)
As we talked about you need to continue to take large breaths.  Please use pain medicine to help.  Please help with your primary care physician if you need more pain medication.  Please use the incentive spirometer as at home.  Return with any signs or symptoms of pneumonia

## 2018-03-20 NOTE — ED Triage Notes (Signed)
Pt states fell on Tuesday during the night and she does not remember and she has no history of sleep walking. Pt is here with right posterior rib pain area and hurts with deep breath and movement.

## 2018-03-20 NOTE — ED Provider Notes (Signed)
[place] [facility] EMERGENCY DEPARTMENT Provider Note   CSN: [id number] Arrival date & time: 03/20/18  0703     History   Chief Complaint Chief Complaint  Patient presents with  . Fall  . Chest Pain    HPI Kaitlyn Jenkins is a 58 y.o. female.  HPI   57 year old female presents after fall.  Patient fell 2 nights ago.  She is unsure why she fell she was up in the middle the night and fell.  Patient having pain to left lateral ribs.  Patient worked at work on Wednesday took yesterday off work and today's had come here to the emergency department.  Patient has pain with deep breaths.  Patient has several incentive spirometers  already at home from prior surgeries.  Past Medical History:  Diagnosis Date  . GERD (gastroesophageal reflux disease)   . H/O hiatal hernia   . Hypothyroidism   . Renal stone 06/2013    There are no active problems to display for this patient.   Past Surgical History:  Procedure Laterality Date  . TOTAL HIP ARTHROPLASTY       OB History   None      Home Medications    Prior to Admission medications   Medication Sig Start Date End Date Taking? Authorizing Provider  CALCIUM PO Take 1 tablet by mouth daily.    [provider]  esomeprazole (NEXIUM 24HR) 20 MG capsule Take 20 mg by mouth every morning.    [provider]  ibuprofen (ADVIL,MOTRIN) 800 MG tablet Take 1 tablet (800 mg total) by mouth 3 (three) times daily. 03/20/18   Lenell Lama Lyn, MD  levothyroxine (SYNTHROID, LEVOTHROID) 100 MCG tablet Take 100 mcg by mouth daily before breakfast.    [provider]  Multiple Vitamin (MULTIVITAMIN WITH MINERALS) TABS tablet Take 1 tablet by mouth daily.    [provider]  Omega-3 Fatty Acids (FISH OIL PO) Take 1 capsule by mouth daily.    [provider]  oxyCODONE-acetaminophen (PERCOCET/ROXICET) 5-325 MG tablet Take 1 tablet by mouth every 6 (six) hours as needed for severe  pain. 03/20/18   Yarnell Arvidson Lyn, MD  phentermine (ADIPEX-P) 37.5 MG tablet Take 37.5 mg by mouth daily before breakfast.    [provider]    Family History No family history on file.  Social History Social History   Tobacco Use  . Smoking status: Current Every Day Smoker    Packs/day: 1.00    Years: 38.00    Pack years: 38.00    Types: Cigarettes  . Smokeless tobacco: Never Used  Substance Use Topics  . Alcohol use: Yes    Alcohol/week: 2.4 oz    Types: 4 Cans of beer per week    Comment: 4/week  . Drug use: No     Allergies   Patient has no known allergies.   Review of Systems Review of Systems  Constitutional: Negative for activity change.  Respiratory: Positive for shortness of breath.   Cardiovascular: Negative for chest pain.  Gastrointestinal: Negative for abdominal pain.  All other systems reviewed and are negative.    Physical Exam Updated Vital Signs BP (!) 166/97 (BP Location: Right Arm)   Pulse 85   Temp 98.1 F (36.7 C) (Oral)   Resp 16   SpO2 95%   Physical Exam  Constitutional: She is oriented to person, place, and time. She appears well-developed and well-nourished.  HENT:  Head: Normocephalic and atraumatic.  Eyes: Right  eye exhibits no discharge.  Cardiovascular: Normal rate, regular rhythm and normal heart sounds.  No murmur heard. Pulmonary/Chest: Effort normal and breath sounds normal. She has no wheezes. She has no rales.  Tenderness to palpation on the left lateral and left posterior ribs.  Abdominal: Soft. She exhibits no distension. There is no tenderness.  No abdominal tenderness near spleen.  Or otherwise.  Neurological: She is oriented to person, place, and time.  Skin: Skin is warm and dry. She is not diaphoretic.  Psychiatric: She has a normal mood and affect.  Nursing note and vitals reviewed.    ED Treatments / Results  Labs (all labs ordered are listed, but only abnormal results are displayed) Labs  Reviewed - No data to display  EKG None  Radiology Dg Ribs Unilateral W/chest Left  Result Date: 03/20/2018 CLINICAL DATA:  Fall 2 days ago with left rib pain. EXAM: LEFT RIBS AND CHEST - 3+ VIEW COMPARISON:  Chest x-ray 08/09/2014 FINDINGS: Lungs are adequately inflated without consolidation or effusion. No pneumothorax. Cardiomediastinal silhouette is within normal. There are fractures of the left sixth seventh, eighth and ninth ribs. IMPRESSION: Acute fractures of the left posterolateral sixth through ninth ribs. No acute cardiopulmonary disease. Electronically Signed   By: Marin Olp M.D.   On: 03/20/2018 08:08    Procedures Procedures (including critical care time)  Medications Ordered in ED Medications  oxyCODONE-acetaminophen (PERCOCET/ROXICET) 5-325 MG per tablet 1 tablet (has no administration in time range)     Initial Impression / Assessment and Plan / ED Course  I have reviewed the triage vital signs and the nursing notes.  Pertinent labs & imaging results that were available during my care of the patient were reviewed by me and considered in my medical decision making (see chart for details).     57 year old female presents after fall.  Patient fell 2 nights ago.  She is unsure why she fell she was up in the middle the night and fell.  Patient having pain to left lateral ribs.  Patient worked at work on Wednesday took yesterday off work and today's had come here to the emergency department.  Patient has pain with deep breaths.  Patient has several incentive spirometers  already at home from prior surgeries.  12:53 PM] Multiple rib fractures noted on chest x-ray.  No evidence of pneumonia.  Encourage patient to take deep breaths, give her pain control, discussed lidocaine patches.  Told her to watch out for signs of pneumonia.  Patient has incentive spirometer at home and understands discharge precautions and instructions.  Final Clinical Impressions(s) / ED Diagnoses    Final diagnoses:  Closed fracture of multiple ribs of left side, initial encounter    ED Discharge Orders        Ordered    oxyCODONE-acetaminophen (PERCOCET/ROXICET) 5-325 MG tablet  Every 6 hours PRN     03/20/18 1249    ibuprofen (ADVIL,MOTRIN) 800 MG tablet  3 times daily     03/20/18 1249       Jaretzi Droz Lyn, MD 03/20/18 1254

## 2018-03-20 NOTE — ED Notes (Signed)
States she fell early Wed. Am . Thinks she could of been sleep walking. ,states she fell. And she is c/o pain in her left lateral    Chest. C/o pain with inspiration.

## 2019-03-09 ENCOUNTER — Telehealth: Payer: Self-pay | Admitting: *Deleted

## 2019-03-09 NOTE — Telephone Encounter (Signed)
Patient called back and was scheduled to see Dr. Denman George on Friday. Patient has not been sick or traveled in the last two weeks. She also has not been exposure in the last two week.

## 2019-03-09 NOTE — Telephone Encounter (Signed)
Called and left the patient a message to call the office back. Need to schedule the patient for a new patient appt  

## 2019-03-12 ENCOUNTER — Other Ambulatory Visit: Payer: Self-pay

## 2019-03-12 ENCOUNTER — Inpatient Hospital Stay: Payer: 59 | Attending: Gynecologic Oncology | Admitting: Gynecologic Oncology

## 2019-03-12 ENCOUNTER — Encounter: Payer: Self-pay | Admitting: Gynecologic Oncology

## 2019-03-12 VITALS — BP 158/92 | HR 80 | Temp 99.0°F | Resp 20 | Ht 63.0 in | Wt 151.0 lb

## 2019-03-12 DIAGNOSIS — N950 Postmenopausal bleeding: Secondary | ICD-10-CM | POA: Diagnosis not present

## 2019-03-12 DIAGNOSIS — F17210 Nicotine dependence, cigarettes, uncomplicated: Secondary | ICD-10-CM | POA: Diagnosis not present

## 2019-03-12 DIAGNOSIS — Z79899 Other long term (current) drug therapy: Secondary | ICD-10-CM | POA: Diagnosis not present

## 2019-03-12 DIAGNOSIS — R1900 Intra-abdominal and pelvic swelling, mass and lump, unspecified site: Secondary | ICD-10-CM | POA: Diagnosis not present

## 2019-03-12 DIAGNOSIS — E039 Hypothyroidism, unspecified: Secondary | ICD-10-CM | POA: Insufficient documentation

## 2019-03-12 DIAGNOSIS — K219 Gastro-esophageal reflux disease without esophagitis: Secondary | ICD-10-CM | POA: Diagnosis not present

## 2019-03-12 NOTE — Progress Notes (Signed)
Consult Note: Gyn-Onc  Consult was requested by Dr. Stann Mainland for the evaluation of Kaitlyn Jenkins 58 y.o. female  CC:  Chief Complaint  Patient presents with  . Pelvic mass    Assessment/Plan:  [name]  is a 58 y.o.  year old with a uterine mass/fibroid approximately 4cm with benign endometrial biopsy and postmenopausal bleeding.   I have a low suspicion that this patient has an underlying malignancy, however it is reasonable to proceed with MRI of the pelvis to obtain better images of the fibroid and evaluate for possible leiomyosarcoma.  If this is an unremarkable examination it is reasonable to consider monitoring the patient for symptoms.  If the symptoms are vaginal bleeding continue my next recommendation would be a D&C procedure, and if that is benign but bleeding continues after that point we could at that point consider hysterectomy.  However I do not recommend proceeding immediately with hysterectomy in this patient with minimal symptoms and a benign endometrial biopsy.  We will schedule an MRI of the pelvis, and will notify the patient of the results and plan moving forward.   HPI: [name] is a 58 year old P0 who is seen in consultation at the request of Dr Stann Mainland for a uterine mass and concern for possible malignancy.  Patient began having occasional postmenopausal spotting in January 2020.  This occurred occasionally and was only spots of blood with wiping after passing urine.  She denies having to wear a pad or having heavy hemorrhage.  She was seen by her OB/GYN Dr. Stann Mainland who performed a transvaginal ultrasound scan on February 22, 2019 and this showed a uterus measuring 7.5 x 4.8 x 5 cm with an endometrium that was difficult to visualize.  There was a mass resembling a myoma measuring 4.8 cm.  The ovaries were not visualized but the adnexa appeared normal.  An endometrial biopsy was performed in the office on March 02, 2019.  This revealed rare benign endometrial  fragments fragments seen in a specimen that consists mainly of bloody mucus.  Pap smear history is unclear.  Patient continues to have occasional spotting.  Her past medical history significant for hypothyroidism, and reflux disease.  Her only prior surgery was a laparoscopic tubal converted to laparotomy due to scar tissue for salpingectomy performed in 1997.  She has been pregnant once with a spontaneous AB.  She has never had childbirth.  Family history is unremarkable for breast ovarian or colon cancers.  The patient is a smoker with a 40-pack-year history.  She denies a history of lung disease.  Current Meds:  Outpatient Encounter Medications as of 03/12/2019  Medication Sig  . esomeprazole (NEXIUM 24HR) 20 MG capsule Take 20 mg by mouth every morning.  Marland Kitchen ibuprofen (ADVIL,MOTRIN) 800 MG tablet Take 1 tablet (800 mg total) by mouth 3 (three) times daily.  . Multiple Vitamin (MULTIVITAMIN WITH MINERALS) TABS tablet Take 1 tablet by mouth daily.  Marland Kitchen CALCIUM PO Take 1 tablet by mouth daily.  Marland Kitchen levothyroxine (SYNTHROID, LEVOTHROID) 125 MCG tablet Take 125 mcg by mouth daily.  . Omega-3 Fatty Acids (FISH OIL PO) Take 1 capsule by mouth daily.  . [DISCONTINUED] levothyroxine (SYNTHROID, LEVOTHROID) 100 MCG tablet Take 100 mcg by mouth daily before breakfast.  . [DISCONTINUED] oxyCODONE-acetaminophen (PERCOCET/ROXICET) 5-325 MG tablet Take 1 tablet by mouth every 6 (six) hours as needed for severe pain.  . [DISCONTINUED] phentermine (ADIPEX-P) 37.5 MG tablet Take 37.5 mg by mouth daily before breakfast.  No facility-administered encounter medications on file as of 03/12/2019.     Allergy:  Allergies  Allergen Reactions  . Codeine Nausea And Vomiting    Per patient 03-12-19    Social Hx:   Social History   Socioeconomic History  . Marital status: Married    Spouse name: Not on file  . Number of children: Not on file  . Years of education: Not on file  . Highest education level:  Not on file  Occupational History  . Not on file  Social Needs  . Financial resource strain: Not on file  . Food insecurity:    Worry: Not on file    Inability: Not on file  . Transportation needs:    Medical: Not on file    Non-medical: Not on file  Tobacco Use  . Smoking status: Current Every Day Smoker    Packs/day: 1.00    Years: 38.00    Pack years: 38.00    Types: Cigarettes  . Smokeless tobacco: Never Used  Substance and Sexual Activity  . Alcohol use: Yes    Alcohol/week: 4.0 standard drinks    Types: 4 Cans of beer per week    Comment: 4/week  . Drug use: No  . Sexual activity: Not Currently  Lifestyle  . Physical activity:    Days per week: Not on file    Minutes per session: Not on file  . Stress: Not on file  Relationships  . Social connections:    Talks on phone: Not on file    Gets together: Not on file    Attends religious service: Not on file    Active member of club or organization: Not on file    Attends meetings of clubs or organizations: Not on file    Relationship status: Not on file  . Intimate partner violence:    Fear of current or ex partner: Not on file    Emotionally abused: Not on file    Physically abused: Not on file    Forced sexual activity: Not on file  Other Topics Concern  . Not on file  Social History Narrative  . Not on file    Past Surgical Hx:  Past Surgical History:  Procedure Laterality Date  . TOTAL HIP ARTHROPLASTY      Past Medical Hx:  Past Medical History:  Diagnosis Date  . GERD (gastroesophageal reflux disease)   . H/O hiatal hernia   . Hypothyroidism   . Renal stone 06/2013    Past Gynecological History:  G0 No LMP recorded. Patient is postmenopausal.  Family Hx: History reviewed. No pertinent family history.  Review of Systems:  Constitutional  Feels well,    ENT Normal appearing ears and nares bilaterally Skin/Breast  No rash, sores, jaundice, itching, dryness Cardiovascular  No chest pain,  shortness of breath, or edema  Pulmonary  No cough or wheeze.  Gastro Intestinal  No nausea, vomitting, or diarrhoea. No bright red blood per rectum, no abdominal pain, change in bowel movement, or constipation.  Genito Urinary  No frequency, urgency, dysuria, + postmenopausal bleeding.  Musculo Skeletal  No myalgia, arthralgia, joint swelling or pain  Neurologic  No weakness, numbness, change in gait,  Psychology  No depression, anxiety, insomnia.   Vitals:  Blood pressure (!) 158/92, pulse 80, temperature 99 F (37.2 C), temperature source Oral, resp. rate 20, height 5\' 3"  (1.6 m), weight 151 lb (68.5 kg), SpO2 97 %.  Physical Exam: WD in NAD Neck  Supple NROM, without any enlargements.  Lymph Node Survey No cervical supraclavicular or inguinal adenopathy Cardiovascular  Pulse normal rate, regularity and rhythm. S1 and S2 normal.  Lungs  Clear to auscultation bilateraly, without wheezes/crackles/rhonchi. Good air movement.  Skin  No rash/lesions/breakdown  Psychiatry  Alert and oriented to person, place, and time  Abdomen  Normoactive bowel sounds, abdomen soft, non-tender and nonobese without evidence of hernia.  Back No CVA tenderness Genito Urinary  Vulva/vagina: Normal external female genitalia.  No lesions. No discharge or bleeding.  Bladder/urethra:  No lesions or masses, well supported bladder  Vagina: normal  Cervix: Normal appearing, no lesions.  Uterus:  Small, mobile, no parametrial involvement or nodularity.  Adnexa: no discrete masses. Rectal  deferred.  Extremities  No bilateral cyanosis, clubbing or edema.   Thereasa Solo, MD  03/12/2019, 12:10 PM

## 2019-03-12 NOTE — Patient Instructions (Addendum)
Plan to have an MRI to better characterize the fibroid and we will contact you with the results. If MRI is unremarkable, it is reasonable to consider monitor for symptoms.  If the symptoms are vaginal bleeding, next recommendation would be a D&C procedure, and if that is benign but bleeding continues after that point we could at that point consider hysterectomy.  Please call for any questions or concerns at [phone].

## 2019-04-19 ENCOUNTER — Ambulatory Visit (HOSPITAL_COMMUNITY): Payer: 59

## 2019-05-05 ENCOUNTER — Ambulatory Visit (HOSPITAL_COMMUNITY): Payer: 59

## 2019-06-04 ENCOUNTER — Telehealth: Payer: Self-pay | Admitting: *Deleted

## 2019-06-04 NOTE — Telephone Encounter (Signed)
Called and left a message to call the office back. Need to see if the patient is ready to reschedule her MRI

## 2019-06-07 ENCOUNTER — Telehealth: Payer: Self-pay | Admitting: *Deleted

## 2019-06-07 NOTE — Telephone Encounter (Signed)
Called and left the patient a message to call the office back. Need to reschedule her MRI that was canceled in April due to [facility].

## 2019-06-09 ENCOUNTER — Telehealth: Payer: Self-pay | Admitting: *Deleted

## 2019-06-09 NOTE — Telephone Encounter (Signed)
Called and left the patient a message to call the office back. Need to know if the patient would like to reschedule her scan appt

## 2020-02-25 ENCOUNTER — Ambulatory Visit: Payer: 59 | Attending: Internal Medicine

## 2020-02-25 DIAGNOSIS — Z23 Encounter for immunization: Secondary | ICD-10-CM | POA: Insufficient documentation

## 2020-02-25 NOTE — Progress Notes (Signed)
   Covid-19 Vaccination Clinic  Name:  VALEN CROFFORD    MRN: [id number] DOB: [birth year]  02/25/2020  Ms. Knechtel was observed post Covid-19 immunization for 15 minutes without incident. She was provided with Vaccine Information Sheet and instruction to access the V-Safe system.   Ms. Woloszyk was instructed to call 911 with any severe reactions post vaccine: Marland Kitchen Difficulty breathing  . Swelling of face and throat  . A fast heartbeat  . A bad rash all over body  . Dizziness and weakness   Immunizations Administered    Name Date Dose VIS Date Route   Pfizer COVID-19 Vaccine 02/25/2020 11:23 AM 0.3 mL 12/03/2019 Intramuscular   Manufacturer: [address]   Lot: [id number]   [place]: [id number]

## 2020-03-28 ENCOUNTER — Ambulatory Visit: Payer: 59 | Attending: Internal Medicine

## 2020-03-28 DIAGNOSIS — Z23 Encounter for immunization: Secondary | ICD-10-CM

## 2020-03-28 NOTE — Progress Notes (Signed)
   Covid-19 Vaccination Clinic  Name:  Kaitlyn Jenkins    MRN: [id number] DOB: 1961-05-22  03/28/2020  [name] was observed post Covid-19 immunization for 15 minutes without incident. She was provided with Vaccine Information Sheet and instruction to access the V-Safe system.   [name] was instructed to call 911 with any severe reactions post vaccine: Marland Kitchen Difficulty breathing  . Swelling of face and throat  . A fast heartbeat  . A bad rash all over body  . Dizziness and weakness   Immunizations Administered    Name Date Dose VIS Date Route   Pfizer COVID-19 Vaccine 03/28/2020 11:17 AM 0.3 mL 12/03/2019 Intramuscular   Manufacturer: [employer]   Lot: [id number]   [place]: [id number]

## 2021-05-02 ENCOUNTER — Other Ambulatory Visit: Payer: Self-pay | Admitting: Obstetrics & Gynecology

## 2021-05-02 DIAGNOSIS — N950 Postmenopausal bleeding: Secondary | ICD-10-CM

## 2021-05-15 ENCOUNTER — Ambulatory Visit
Admission: RE | Admit: 2021-05-15 | Discharge: 2021-05-15 | Disposition: A | Discharge status: Home / Self Care | Payer: No Typology Code available for payment source | Source: Ambulatory Visit | Attending: Obstetrics & Gynecology | Admitting: Obstetrics & Gynecology

## 2021-05-15 DIAGNOSIS — N950 Postmenopausal bleeding: Secondary | ICD-10-CM

## 2021-05-24 ENCOUNTER — Telehealth: Payer: Self-pay | Admitting: *Deleted

## 2021-05-24 NOTE — Telephone Encounter (Signed)
Spoke with the patient to schedule a follow up appt with Dr Denman George, Patient not feeling well. Patient took COVID test yesterday and will call back with the results and then schedule appt

## 2021-05-31 ENCOUNTER — Other Ambulatory Visit: Payer: Self-pay

## 2021-05-31 ENCOUNTER — Inpatient Hospital Stay: Payer: No Typology Code available for payment source | Attending: Gynecologic Oncology | Admitting: Gynecologic Oncology

## 2021-05-31 ENCOUNTER — Other Ambulatory Visit: Payer: Self-pay | Admitting: Gynecologic Oncology

## 2021-05-31 VITALS — BP 144/82 | HR 88 | Temp 97.5°F | Resp 18 | Ht 63.0 in | Wt 142.4 lb

## 2021-05-31 DIAGNOSIS — N736 Female pelvic peritoneal adhesions (postinfective): Secondary | ICD-10-CM | POA: Diagnosis not present

## 2021-05-31 DIAGNOSIS — K219 Gastro-esophageal reflux disease without esophagitis: Secondary | ICD-10-CM | POA: Insufficient documentation

## 2021-05-31 DIAGNOSIS — F17210 Nicotine dependence, cigarettes, uncomplicated: Secondary | ICD-10-CM | POA: Diagnosis not present

## 2021-05-31 DIAGNOSIS — N950 Postmenopausal bleeding: Secondary | ICD-10-CM

## 2021-05-31 DIAGNOSIS — D251 Intramural leiomyoma of uterus: Secondary | ICD-10-CM | POA: Diagnosis not present

## 2021-05-31 DIAGNOSIS — E039 Hypothyroidism, unspecified: Secondary | ICD-10-CM | POA: Diagnosis not present

## 2021-05-31 NOTE — Progress Notes (Signed)
Follow-up Note: Gyn-Onc  Consult was requested by Dr. Stann Mainland for the evaluation of Kaitlyn Jenkins 60 y.o. female  CC:  Chief Complaint  Patient presents with   post menopausal bleeding   Fibroids     Assessment/Plan:  Kaitlyn Jenkins  is a 60 y.o.  year old with a uterine mass/fibroid approximately 6cm with benign endometrial biopsy and postmenopausal bleeding since 2020.  Non diagnostic (discordant) office biopsy.  Given the persistent nature of her postmenopausal bleeding and non-diagnostic office sampling, I am recommending proceeding with D&C. If this is benign, she is electing for hysterectomy for increased growth of the fibroid (2cm in 2 years). While I continue to have a low suspicion for LMS, I think this is reasonable. I explained that hysterectomy is associated with increased risk for complications for her given her history of known pelvic adhesive disease. If her endometrial sampling shows endometrial cancer, I will recommend robotic hysterectomy, BSO and SLN biopsy. She may require minilap for specimen delivery due to her nulliparous state and 6cm fibroid. She has elected to delay her workup until after July 1st due to a change in insurance.  HPI: Kaitlyn Jenkins is a 60 year old P0 who is seen in consultation at the request of Dr Stann Mainland for a uterine mass and concern for possible malignancy.  Patient began having occasional postmenopausal spotting in January 2020.  This occurred occasionally and was only spots of blood with wiping after passing urine.  She denies having to wear a pad or having heavy hemorrhage.  She was seen by her OB/GYN Dr. Stann Mainland who performed a transvaginal ultrasound scan on February 22, 2019 and this showed a uterus measuring 7.5 x 4.8 x 5 cm with an endometrium that was difficult to visualize.  There was a mass resembling a myoma measuring 4.8 cm.  The ovaries were not visualized but the adnexa appeared normal.  An endometrial biopsy was performed in  the office on March 02, 2019.  This revealed rare benign endometrial fragments fragments seen in a specimen that consists mainly of bloody mucus.  Interval Hx:  She was seen by me at that time and my recommendation was further work-up with an MRI and D&C. However, when the [place] pandemic hit, she canceled her MRI appointment and was lost to follow-up. She had no bleeding for 2 years until early May, 2022 when she began experiencing persistent daily light bleeding. She returned to see Dr Stann Mainland on 05/07/21 who performed a endometrial biopsy that showed rare inactive endometrial glands, predominantly blood, extremely scanty tissue.  Pap smear was taken however the the specimen was unsatisfactory for evaluation due to insufficient cellularity.  Transvaginal ultrasound scan was performed on 05/15/2021 and revealed a uterus measuring 6.6 x 5.8 x 7.5 cm.  To the endometrium was difficult to visualize given the overlying fibroid however the visualized portions were measured to be thickened up to 21 mm.  The right and left ovaries were grossly normal bilaterally.  There was a 6.4 cm intramural fibroid at the central aspect of the uterine fundus (this had been previously 4.8 cm).  The fibroid showed a heterogeneous make up with some Doppler signal on the periphery of the fibroid.  Her past medical history significant for hypothyroidism, and reflux disease.  Her only prior surgery was a laparoscopic tubal converted to laparotomy due to scar tissue for salpingectomy performed in 1997.  She has been pregnant once with a spontaneous AB.  She has never had childbirth.  Family history is unremarkable for breast ovarian or colon cancers.  The patient is a smoker with a 40-pack-year history.  She denies a history of lung disease.  Current Meds:  Outpatient Encounter Medications as of 05/31/2021  Medication Sig   CALCIUM PO Take 1 tablet by mouth daily.   esomeprazole (NEXIUM 24HR) 20 MG capsule Take 20 mg by mouth every  morning.   ibuprofen (ADVIL,MOTRIN) 800 MG tablet Take 1 tablet (800 mg total) by mouth 3 (three) times daily.   levothyroxine (SYNTHROID, LEVOTHROID) 125 MCG tablet Take 125 mcg by mouth daily.   Multiple Vitamin (MULTIVITAMIN WITH MINERALS) TABS tablet Take 1 tablet by mouth daily.   Omega-3 Fatty Acids (FISH OIL PO) Take 1 capsule by mouth daily.   No facility-administered encounter medications on file as of 05/31/2021.    Allergy:  Allergies  Allergen Reactions   Codeine Nausea And Vomiting    Per patient 03-12-19    Social Hx:   Social History   Socioeconomic History   Marital status: Married    Spouse name: Not on file   Number of children: Not on file   Years of education: Not on file   Highest education level: Not on file  Occupational History   Not on file  Tobacco Use   Smoking status: Every Day    Packs/day: 1.00    Years: 38.00    Pack years: 38.00    Types: Cigarettes   Smokeless tobacco: Never  Vaping Use   Vaping Use: Never used  Substance and Sexual Activity   Alcohol use: Yes    Alcohol/week: 4.0 standard drinks    Types: 4 Cans of beer per week    Comment: 4/week   Drug use: No   Sexual activity: Not Currently  Other Topics Concern   Not on file  Social History Narrative   Not on file   Social Determinants of Health   Financial Resource Strain: Not on file  Food Insecurity: Not on file  Transportation Needs: Not on file  Physical Activity: Not on file  Stress: Not on file  Social Connections: Not on file  Intimate Partner Violence: Not on file    Past Surgical Hx:  Past Surgical History:  Procedure Laterality Date   TOTAL HIP ARTHROPLASTY      Past Medical Hx:  Past Medical History:  Diagnosis Date   GERD (gastroesophageal reflux disease)    H/O hiatal hernia    Hypothyroidism    Renal stone 06/2013    Past Gynecological History:  G0 No LMP recorded. Patient is postmenopausal.  Family Hx: No family history on file.  Review of  Systems:  Constitutional  Feels well,    ENT Normal appearing ears and nares bilaterally Skin/Breast  No rash, sores, jaundice, itching, dryness Cardiovascular  No chest pain, shortness of breath, or edema  Pulmonary  No cough or wheeze.  Gastro Intestinal  No nausea, vomitting, or diarrhoea. No bright red blood per rectum, no abdominal pain, change in bowel movement, or constipation.  Genito Urinary  No frequency, urgency, dysuria, + postmenopausal bleeding.  Musculo Skeletal  No myalgia, arthralgia, joint swelling or pain  Neurologic  No weakness, numbness, change in gait,  Psychology  No depression, anxiety, insomnia.   Vitals:  Blood pressure (!) 144/82, pulse 88, temperature (!) 97.5 F (36.4 C), temperature source Tympanic, resp. rate 18, height 5\' 3"  (1.6 m), weight 142 lb 6.4 oz (64.6 kg), SpO2 97 %.  Physical Exam: WD  in NAD Neck  Supple NROM, without any enlargements.  Lymph Node Survey No cervical supraclavicular or inguinal adenopathy Cardiovascular  Pulse normal rate, regularity and rhythm. S1 and S2 normal.  Lungs  Clear to auscultation bilateraly, without wheezes/crackles/rhonchi. Good air movement.  Skin  No rash/lesions/breakdown  Psychiatry  Alert and oriented to person, place, and time  Abdomen  Normoactive bowel sounds, abdomen soft, non-tender and nonobese without evidence of hernia.  Back No CVA tenderness Genito Urinary  Vulva/vagina: Normal external female genitalia.  No lesions. No discharge or bleeding.  Bladder/urethra:  No lesions or masses, well supported bladder  Vagina: normal  Cervix: Normal appearing, no lesions.  Uterus:  Small, mobile, no parametrial involvement or nodularity.  Adnexa: no discrete masses. Rectal  deferred.  Extremities  No bilateral cyanosis, clubbing or edema.   Thereasa Solo, MD  05/31/2021, 10:45 AM

## 2021-05-31 NOTE — Patient Instructions (Signed)
Preparing for your Surgery  Plan for surgery on June 27, 2021 with Dr. Everitt Amber at [facility]. You will be scheduled for a dilation and curettage of the uterus.   Pre-operative Testing -You will receive a phone call from presurgical testing at [facility] to discuss surgery instructions and arrange for lab work if needed.  -Bring your insurance card, copy of an advanced directive if applicable, medication list.  -You should not be taking blood thinners or aspirin at least ten days prior to surgery unless instructed by your surgeon.  -Do not take supplements such as fish oil (omega 3), red yeast rice, turmeric before your surgery. You want to avoid medications with aspirin in them including headache powders such as BC or Goody's), Excedrin migraine.  Day Before Surgery at [place] will be advised you can have clear liquids up until 3 hours before your surgery.    Your role in recovery Your role is to become active as soon as directed by your doctor, while still giving yourself time to heal.  Rest when you feel tired. You will be asked to do the following in order to speed your recovery:  - Cough and breathe deeply. This helps to clear and expand your lungs and can prevent pneumonia after surgery.  - [place]. Do mild physical activity. Walking or moving your legs help your circulation and body functions return to normal. Do not try to get up or walk alone the first time after surgery.   -If you develop swelling on one leg or the other, pain in the back of your leg, redness/warmth in one of your legs, please call the office or go to the Emergency Room to have a doppler to rule out a blood clot. For shortness of breath, chest pain-seek care in the Emergency Room as soon as possible. - Actively manage your pain. Managing your pain lets you move in comfort. We will ask you to rate your pain on a scale of zero to 10. It is your  responsibility to tell your doctor or nurse where and how much you hurt so your pain can be treated.  Special Considerations -Your final pathology results from surgery should be available around one week after surgery and the results will be relayed to you when available.  -FMLA forms can be faxed to [phone] and please allow 5-7 business days for completion.  Pain Management After Surgery -Make sure that you have Tylenol and Ibuprofen at home to use on a regular basis after surgery for pain control. We recommend alternating the medications every hour to six hours since they work differently and are processed in the body differently for pain relief.  -Review the attached handout on narcotic use and their risks and side effects.   Bowel Regimen -It is important to prevent constipation and drink adequate amounts of liquids.   Risks of Surgery Risks of surgery are low but include bleeding, infection, damage to surrounding structures, re-operation, blood clots, and very rarely death.  AFTER SURGERY INSTRUCTIONS  Return to work: 1 day if needed  Activity: 1. Be up and out of the bed during the day.  Take a nap if needed.  You may walk up steps but be careful and use the hand rail.  Stair climbing will tire you more than you think, you may need to stop part way and rest.   2. No driving for minimum 24 hours after surgery.  3. You can shower and bathe normally.   4. No sexual activity and nothing in the vagina for 48 hours.  5. You may experience vaginal spotting and discharge after surgery.  The spotting is normal but if you experience heavy bleeding, call our office.  6. Take Tylenol or ibuprofen for pain. Monitor your Tylenol intake to a max of 4,000 mg in a 24 hour period.   Diet: 1. Low sodium Heart Healthy Diet is recommended but you are cleared to resume your normal (before surgery) diet after your procedure.  2. It is safe to use a laxative, such as Miralax or Colace, if  you have difficulty moving your bowels.   Wound Care: 1. Keep clean and dry.  Shower daily.  Reasons to call the Doctor: Fever - Oral temperature greater than 100.4 degrees Fahrenheit Foul-smelling vaginal discharge Difficulty urinating Nausea and vomiting Increased pain at the site of the incision that is unrelieved with pain medicine. Difficulty breathing with or without chest pain New calf pain especially if only on one side Sudden, continuing increased vaginal bleeding with or without clots.   Contacts: For questions or concerns you should contact:  Dr. Everitt Amber at [phone]  Joylene John, NP at [phone]  After Hours: call [phone] and have the GYN Oncologist paged/contacted (after 5 pm or on the weekends).  Messages sent via mychart are for non-urgent matters and are not responded to after hours so for urgent needs, please call the after hours number.

## 2021-06-18 ENCOUNTER — Telehealth: Payer: Self-pay | Admitting: *Deleted

## 2021-06-18 NOTE — Telephone Encounter (Signed)
Per Melissa APP "Patient appt canceled for 7/1.  Patient was confused with the appt and both surgeries. Explained that we will schedule the new appt after we receive the resulte from the first."

## 2021-06-22 ENCOUNTER — Other Ambulatory Visit: Payer: Self-pay

## 2021-06-22 ENCOUNTER — Telehealth: Payer: Self-pay | Admitting: *Deleted

## 2021-06-22 ENCOUNTER — Encounter (HOSPITAL_BASED_OUTPATIENT_CLINIC_OR_DEPARTMENT_OTHER): Payer: Self-pay | Admitting: Gynecologic Oncology

## 2021-06-22 ENCOUNTER — Encounter: Payer: No Typology Code available for payment source | Admitting: Gynecologic Oncology

## 2021-06-22 NOTE — Progress Notes (Signed)
Spoke w/ via phone for pre-op interview---pt Lab needs dos----  none             Lab results------none COVID test -----patient states asymptomatic no test needed Arrive at ------- NPO after MN NO Solid Food.  Clear liquids from MN until--0945 - Med rec completed Medications to take morning of surgery -----Nexuim, synthroid, MVI Diabetic medication ----n/a- Patient instructed no nail polish to be worn day of surgery Patient instructed to bring photo id and insurance card day of surgery Patient aware to have Driver (ride ) / husband Academic librarian    for 24 hours after surgery  Patient Special Instructions -----stop smoking 24 hrs before surgery Pre-Op special Istructions -----none Patient verbalized understanding of instructions that were given at this phone interview. Patient denies shortness of breath, chest pain, fever, cough at this phone interview.

## 2021-06-22 NOTE — Telephone Encounter (Signed)
Patient called and wanted to know when she would receive a message from pre surgery center. Explained that the pre service center will call her on 7/5. Patient verbalized understanding

## 2021-06-26 ENCOUNTER — Telehealth: Payer: Self-pay

## 2021-06-26 NOTE — Telephone Encounter (Signed)
Telephone call to check on pre-operative status.  Patient compliant with pre-operative instructions.  Reinforced NPO after midnight.  No questions or concerns voiced.  Instructed to call for any needs.   

## 2021-06-27 ENCOUNTER — Ambulatory Visit (HOSPITAL_BASED_OUTPATIENT_CLINIC_OR_DEPARTMENT_OTHER)
Admission: RE | Admit: 2021-06-27 | Payer: No Typology Code available for payment source | Source: Home / Self Care | Admitting: Gynecologic Oncology

## 2021-06-27 ENCOUNTER — Telehealth: Payer: Self-pay

## 2021-06-27 SURGERY — DILATION AND CURETTAGE
Anesthesia: Choice

## 2021-06-27 NOTE — Telephone Encounter (Signed)
Received phone call from Kaitlyn Jenkins this morning. She is scheduled for surgery at 1245 today. Patient states her husband felt terrible in the night and took a COVID test which was positive. She has no friends or family in the area that can assist her with her surgery and she needs to cancel. [facility] notified of cancellation. Instructed patient we will be in touch with her to reschedule her surgery. Patient verbalized understanding.

## 2021-06-28 ENCOUNTER — Telehealth: Payer: Self-pay

## 2021-06-28 NOTE — Telephone Encounter (Signed)
Offered July 26 or August 3 for potential surgery dates. Pt choose July 26 th. Kaitlyn John, NP notified of choice.

## 2021-06-28 NOTE — Telephone Encounter (Signed)
Told Kaitlyn Jenkins that her surgery needs tyo be moved from 07-17-21 to 07-12-21.  Pt verbalized understanding.

## 2021-07-03 ENCOUNTER — Other Ambulatory Visit: Payer: Self-pay | Admitting: Orthopedic Surgery

## 2021-07-03 DIAGNOSIS — M25521 Pain in right elbow: Secondary | ICD-10-CM

## 2021-07-04 ENCOUNTER — Ambulatory Visit
Admission: RE | Admit: 2021-07-04 | Discharge: 2021-07-04 | Disposition: A | Discharge status: Home / Self Care | Payer: No Typology Code available for payment source | Source: Ambulatory Visit | Attending: Orthopedic Surgery | Admitting: Orthopedic Surgery

## 2021-07-04 DIAGNOSIS — M25521 Pain in right elbow: Secondary | ICD-10-CM

## 2021-07-06 ENCOUNTER — Encounter (HOSPITAL_BASED_OUTPATIENT_CLINIC_OR_DEPARTMENT_OTHER): Payer: Self-pay | Admitting: Gynecologic Oncology

## 2021-07-06 ENCOUNTER — Other Ambulatory Visit: Payer: Self-pay

## 2021-07-06 DIAGNOSIS — S59909A Unspecified injury of unspecified elbow, initial encounter: Secondary | ICD-10-CM

## 2021-07-06 HISTORY — DX: Unspecified injury of unspecified elbow, initial encounter: S59.909A

## 2021-07-06 NOTE — Progress Notes (Signed)
Spoke w/ via phone for pre-op interview--- Lab needs dos----     n/a          Lab results------n/a COVID test -----patient states asymptomatic no test needed Arrive at -------0530 07/12/21 NPO after MN NO Solid Food.  Clear liquids from MN until--- Med rec completed Medications to take morning of surgery -----Nexuim 20 mg, Synthroid 125 mcg Diabetic medication -----n/a Patient instructed no nail polish to be worn day of surgery Patient instructed to bring photo id and insurance card day of surgery Patient aware to have Driver (ride ) / caregiver husband gregory    for 24 hours after surgery  Patient Special Instructions -----n/a Pre-Op special Istructions -----n/a Patient verbalized understanding of instructions that were given at this phone interview. Patient denies shortness of breath, chest pain, fever, cough at this phone interview.    Husband had covid on July 6 and symptoms ended on July 8.  Pt tested negative for covid

## 2021-07-10 ENCOUNTER — Other Ambulatory Visit: Payer: Self-pay | Admitting: Gynecologic Oncology

## 2021-07-10 DIAGNOSIS — N950 Postmenopausal bleeding: Secondary | ICD-10-CM

## 2021-07-11 ENCOUNTER — Telehealth: Payer: Self-pay

## 2021-07-11 NOTE — Telephone Encounter (Signed)
Pt states she understands her pre-op instructions. Reviewed the procedure will take ~30 minutes. She will be in recovery ~1 hour pending how she wakes up from anesthesia.

## 2021-07-11 NOTE — Anesthesia Preprocedure Evaluation (Addendum)
Anesthesia Evaluation  Patient identified by MRN, date of birth, ID band Patient awake    Reviewed: Allergy & Precautions, NPO status , Patient's Chart, lab work & pertinent test results  Airway Mallampati: II  TM Distance: >3 FB Neck ROM: Full    Dental no notable dental hx.    Pulmonary neg pulmonary ROS, Current Smoker and Patient abstained from smoking.,    Pulmonary exam normal breath sounds clear to auscultation       Cardiovascular negative cardio ROS Normal cardiovascular exam Rhythm:Regular Rate:Normal     Neuro/Psych negative neurological ROS  negative psych ROS   GI/Hepatic Neg liver ROS, GERD  ,  Endo/Other  Hypothyroidism   Renal/GU negative Renal ROS  negative genitourinary   Musculoskeletal negative musculoskeletal ROS (+)   Abdominal   Peds negative pediatric ROS (+)  Hematology negative hematology ROS (+)   Anesthesia Other Findings   Reproductive/Obstetrics negative OB ROS                            Anesthesia Physical Anesthesia Plan  ASA: 2  Anesthesia Plan: MAC   Post-op Pain Management:    Induction: Intravenous  PONV Risk Score and Plan: 2 and Propofol infusion, Ondansetron and Treatment may vary due to age or medical condition  Airway Management Planned: Simple Face Mask  Additional Equipment:   Intra-op Plan:   Post-operative Plan:   Informed Consent: I have reviewed the patients History and Physical, chart, labs and discussed the procedure including the risks, benefits and alternatives for the proposed anesthesia with the patient or authorized representative who has indicated his/her understanding and acceptance.     Dental advisory given  Plan Discussed with: CRNA and Surgeon  Anesthesia Plan Comments:         Anesthesia Quick Evaluation

## 2021-07-12 ENCOUNTER — Encounter (HOSPITAL_BASED_OUTPATIENT_CLINIC_OR_DEPARTMENT_OTHER)
Admission: RE | Disposition: A | Discharge status: Home / Self Care | Payer: Self-pay | Source: Home / Self Care | Attending: Gynecologic Oncology

## 2021-07-12 ENCOUNTER — Ambulatory Visit (HOSPITAL_BASED_OUTPATIENT_CLINIC_OR_DEPARTMENT_OTHER): Payer: No Typology Code available for payment source | Admitting: Anesthesiology

## 2021-07-12 ENCOUNTER — Ambulatory Visit (HOSPITAL_BASED_OUTPATIENT_CLINIC_OR_DEPARTMENT_OTHER)
Admission: RE | Admit: 2021-07-12 | Discharge: 2021-07-12 | Disposition: A | Discharge status: Home / Self Care | Payer: No Typology Code available for payment source | Attending: Gynecologic Oncology | Admitting: Gynecologic Oncology

## 2021-07-12 ENCOUNTER — Encounter (HOSPITAL_BASED_OUTPATIENT_CLINIC_OR_DEPARTMENT_OTHER): Payer: Self-pay | Admitting: Gynecologic Oncology

## 2021-07-12 ENCOUNTER — Other Ambulatory Visit: Payer: Self-pay

## 2021-07-12 DIAGNOSIS — Z79890 Hormone replacement therapy (postmenopausal): Secondary | ICD-10-CM | POA: Diagnosis not present

## 2021-07-12 DIAGNOSIS — F17210 Nicotine dependence, cigarettes, uncomplicated: Secondary | ICD-10-CM | POA: Diagnosis not present

## 2021-07-12 DIAGNOSIS — Z791 Long term (current) use of non-steroidal anti-inflammatories (NSAID): Secondary | ICD-10-CM | POA: Diagnosis not present

## 2021-07-12 DIAGNOSIS — N950 Postmenopausal bleeding: Secondary | ICD-10-CM | POA: Insufficient documentation

## 2021-07-12 DIAGNOSIS — Z79899 Other long term (current) drug therapy: Secondary | ICD-10-CM | POA: Diagnosis not present

## 2021-07-12 DIAGNOSIS — Z885 Allergy status to narcotic agent status: Secondary | ICD-10-CM | POA: Insufficient documentation

## 2021-07-12 DIAGNOSIS — E039 Hypothyroidism, unspecified: Secondary | ICD-10-CM | POA: Insufficient documentation

## 2021-07-12 DIAGNOSIS — D251 Intramural leiomyoma of uterus: Secondary | ICD-10-CM | POA: Diagnosis present

## 2021-07-12 DIAGNOSIS — K219 Gastro-esophageal reflux disease without esophagitis: Secondary | ICD-10-CM | POA: Diagnosis not present

## 2021-07-12 HISTORY — PX: DILATION AND CURETTAGE OF UTERUS: SHX78

## 2021-07-12 SURGERY — DILATION AND CURETTAGE
Anesthesia: Monitor Anesthesia Care

## 2021-07-12 MED ORDER — LIDOCAINE HCL 1 % IJ SOLN
INTRAMUSCULAR | Status: DC | PRN
  Administered 2021-07-12: 10 mL

## 2021-07-12 MED ORDER — DEXAMETHASONE SODIUM PHOSPHATE 10 MG/ML IJ SOLN
INTRAMUSCULAR | Status: AC
  Filled 2021-07-12: qty 1

## 2021-07-12 MED ORDER — GABAPENTIN 300 MG PO CAPS
ORAL_CAPSULE | ORAL | Status: AC
  Filled 2021-07-12: qty 1

## 2021-07-12 MED ORDER — SCOPOLAMINE 1 MG/3DAYS TD PT72
1.0000 | MEDICATED_PATCH | TRANSDERMAL | Status: DC
  Administered 2021-07-12: 1.5 mg via TRANSDERMAL

## 2021-07-12 MED ORDER — ONDANSETRON HCL 4 MG/2ML IJ SOLN
INTRAMUSCULAR | Status: DC | PRN
  Administered 2021-07-12: 4 mg via INTRAVENOUS

## 2021-07-12 MED ORDER — KETOROLAC TROMETHAMINE 30 MG/ML IJ SOLN: 30.0000 mg | Freq: Once | INTRAMUSCULAR | Status: DC

## 2021-07-12 MED ORDER — LACTATED RINGERS IV SOLN: INTRAVENOUS | Status: DC

## 2021-07-12 MED ORDER — CELECOXIB 200 MG PO CAPS
ORAL_CAPSULE | ORAL | Status: AC
  Filled 2021-07-12: qty 2

## 2021-07-12 MED ORDER — PROPOFOL 10 MG/ML IV BOLUS
INTRAVENOUS | Status: AC
  Filled 2021-07-12: qty 20

## 2021-07-12 MED ORDER — CELECOXIB 200 MG PO CAPS
400.0000 mg | ORAL_CAPSULE | ORAL | Status: AC
Start: 2021-07-12 — End: 2021-07-12
  Administered 2021-07-12: 400 mg via ORAL

## 2021-07-12 MED ORDER — ACETAMINOPHEN 500 MG PO TABS
ORAL_TABLET | ORAL | Status: AC
  Filled 2021-07-12: qty 2

## 2021-07-12 MED ORDER — DEXAMETHASONE SODIUM PHOSPHATE 10 MG/ML IJ SOLN: 4.0000 mg | INTRAMUSCULAR | Status: DC

## 2021-07-12 MED ORDER — SCOPOLAMINE 1 MG/3DAYS TD PT72
MEDICATED_PATCH | TRANSDERMAL | Status: AC
  Filled 2021-07-12: qty 1

## 2021-07-12 MED ORDER — FENTANYL CITRATE (PF) 100 MCG/2ML IJ SOLN
INTRAMUSCULAR | Status: DC | PRN
  Administered 2021-07-12 (×2): 50 ug via INTRAVENOUS

## 2021-07-12 MED ORDER — FENTANYL CITRATE (PF) 100 MCG/2ML IJ SOLN
INTRAMUSCULAR | Status: AC
  Filled 2021-07-12: qty 2

## 2021-07-12 MED ORDER — MIDAZOLAM HCL 2 MG/2ML IJ SOLN
INTRAMUSCULAR | Status: AC
  Filled 2021-07-12: qty 2

## 2021-07-12 MED ORDER — ARTIFICIAL TEARS OPHTHALMIC OINT
TOPICAL_OINTMENT | OPHTHALMIC | Status: AC
  Filled 2021-07-12: qty 3.5

## 2021-07-12 MED ORDER — ONDANSETRON HCL 4 MG/2ML IJ SOLN
INTRAMUSCULAR | Status: AC
  Filled 2021-07-12: qty 2

## 2021-07-12 MED ORDER — PROPOFOL 10 MG/ML IV BOLUS
INTRAVENOUS | Status: DC | PRN
  Administered 2021-07-12: 30 mg via INTRAVENOUS

## 2021-07-12 MED ORDER — GABAPENTIN 300 MG PO CAPS
300.0000 mg | ORAL_CAPSULE | ORAL | Status: AC
Start: 2021-07-12 — End: 2021-07-12
  Administered 2021-07-12: 300 mg via ORAL

## 2021-07-12 MED ORDER — MIDAZOLAM HCL 5 MG/5ML IJ SOLN
INTRAMUSCULAR | Status: DC | PRN
  Administered 2021-07-12: 2 mg via INTRAVENOUS

## 2021-07-12 MED ORDER — PROPOFOL 500 MG/50ML IV EMUL
INTRAVENOUS | Status: DC | PRN
  Administered 2021-07-12: 200 ug/kg/min via INTRAVENOUS

## 2021-07-12 MED ORDER — LIDOCAINE HCL (PF) 2 % IJ SOLN
INTRAMUSCULAR | Status: AC
  Filled 2021-07-12: qty 5

## 2021-07-12 MED ORDER — FENTANYL CITRATE (PF) 100 MCG/2ML IJ SOLN: 25.0000 ug | INTRAMUSCULAR | Status: DC | PRN

## 2021-07-12 MED ORDER — PROPOFOL 500 MG/50ML IV EMUL
INTRAVENOUS | Status: AC
  Filled 2021-07-12: qty 50

## 2021-07-12 MED ORDER — ONDANSETRON HCL 4 MG/2ML IJ SOLN: 4.0000 mg | Freq: Once | INTRAMUSCULAR | Status: DC | PRN

## 2021-07-12 MED ORDER — ACETAMINOPHEN 500 MG PO TABS
1000.0000 mg | ORAL_TABLET | ORAL | Status: AC
  Administered 2021-07-12: 1000 mg via ORAL

## 2021-07-12 MED ORDER — LIDOCAINE 2% (20 MG/ML) 5 ML SYRINGE
INTRAMUSCULAR | Status: DC | PRN
  Administered 2021-07-12: 100 mg via INTRAVENOUS

## 2021-07-12 SURGICAL SUPPLY — 11 items
CATH ROBINSON RED A/P 16FR (CATHETERS) ×2 IMPLANT
GAUZE 4X4 16PLY ~~LOC~~+RFID DBL (SPONGE) ×2 IMPLANT
GLOVE SURG ENC MOIS LTX SZ6 (GLOVE) ×4 IMPLANT
GOWN STRL REUS W/TWL LRG LVL3 (GOWN DISPOSABLE) ×3 IMPLANT
KIT TURNOVER CYSTO (KITS) ×2 IMPLANT
NS IRRIG 500ML POUR BTL (IV SOLUTION) ×1 IMPLANT
PACK VAGINAL MINOR WOMEN LF (CUSTOM PROCEDURE TRAY) ×2 IMPLANT
PAD OB MATERNITY 4.3X12.25 (PERSONAL CARE ITEMS) ×2 IMPLANT
SUT VIC AB 0 CT1 36 (SUTURE) ×2 IMPLANT
SUT VIC AB 2-0 UR6 27 (SUTURE) IMPLANT
TOWEL OR 17X26 10 PK STRL BLUE (TOWEL DISPOSABLE) ×2 IMPLANT

## 2021-07-12 NOTE — Transfer of Care (Signed)
Immediate Anesthesia Transfer of Care Note  Patient: Kaitlyn Jenkins  Procedure(s) Performed: DILATATION AND CURETTAGE  Patient Location: PACU  Anesthesia Type:MAC  Level of Consciousness: awake, alert , oriented and patient cooperative  Airway & Oxygen Therapy: Patient Spontanous Breathing  Post-op Assessment: Report given to RN and Post -op Vital signs reviewed and stable  Post vital signs: Reviewed and stable  Last Vitals:  Vitals Value Taken Time  BP 145/87 07/12/21 0755  Temp 36.4 C 07/12/21 0755  Pulse 78 07/12/21 0757  Resp 18 07/12/21 0757  SpO2 94 % 07/12/21 0757  Vitals shown include unvalidated device data.  Last Pain:  Vitals:   07/12/21 0545  TempSrc: Oral  PainSc: 0-No pain         Complications: No notable events documented.

## 2021-07-12 NOTE — Discharge Instructions (Addendum)
D&C Care After ACTIVITY Rest as much as possible the first day after discharge. You do not have weight restrictions on lifting Avoid strenuous working out such as running or lifting weights for 48 hours You can climb stairs and drive a car NUTRITION You may resume your normal diet. Drink 6 to 8 glasses of fluids a day. Eat a healthy, balanced diet including portions of food from the meat (protein), milk, fruit, vegetable, and bread groups. Your caregiver may recommend you take a multivitamin with iron.  ELIMINATION  If constipation occurs, drink more liquids, and add more fruits, vegetables, and bran to your diet. You may take a mild laxative, such as Milk of Magnesia, Metamucil, or a stool softener such as Colace, with permission from your caregiver.  HYGIENE You may shower and wash your hair. Avoid tub baths for 4 weeks Do not add any bath oils or chemicals to your bath water, after you have permission to take baths. Avoid placing anything in the vagina for 2 weeks. It is normal to pass some blood (less than a period type bleeding) for up to a month  [place]  Take your temperature twice a day and record it, especially if you feel feverish or have chills. Follow your caregiver's instructions about medicines, activity, and follow-up appointments after surgery. Do not drink alcohol while taking pain medicine. You may take over-the-counter medicine for pain, recommended by your caregiver. If your pain is not relieved with medicine, call your caregiver. Do not take aspirin because it can cause bleeding. Do not douche or use tampons (use a nonperfumed sanitary pad). Do not have sexual intercourse for 4 weeks postoperatively. Hugging, kissing, and playful sexual activity is fine with your caregiver's permission. Take showers instead of baths, until your caregiver gives you permission to take baths. You may take a mild medicine for constipation, recommended by your  caregiver. Bran foods and drinking a lot of fluids will help with constipation. Make sure your family understands everything about your operation and recovery.  SEEK MEDICAL CARE IF:   You notice a foul smell coming from the vagina. You have painful or bloody urination. You develop nausea and vomiting. You develop diarrhea. You develop a rash. You have a reaction or allergy from the medicine. You feel dizzy or light-headed. You need stronger pain medicine.  SEEK IMMEDIATE MEDICAL CARE IF:  You develop a temperature of 102 F (38.9 C) or higher. You pass out. You develop leg or chest pain. You develop abdominal pain. You develop shortness of breath. You bleed heavier than a period (soaking through 2 or more pads per hour for 2 hours in a row). You see pus in the wound area.  MAKE SURE YOU:  Understand these instructions. Will watch your condition. Will get help right away if you are not doing well or get worse. Document Released: 07/23/2004 Document Revised: 04/25/2014 Document Reviewed: 11/10/2009 [facility] Patient Information 2015 [place], [place]. This information is not intended to replace advice given to you by your health care provider. Make sure you discuss any questions you have with your health care provider.    Post Anesthesia Home Care Instructions  Activity: Get plenty of rest for the remainder of the day. A responsible individual must stay with you for 24 hours following the procedure.  For the next 24 hours, DO NOT: -Drive a car -Paediatric nurse -Drink alcoholic beverages -Take any medication unless instructed by your physician -Make any legal decisions or sign important papers.  Meals:  Start with liquid foods such as gelatin or soup. Progress to regular foods as tolerated. Avoid greasy, spicy, heavy foods. If nausea and/or vomiting occur, drink only clear liquids until the nausea and/or vomiting subsides. Call your physician if vomiting continues.  Special  Instructions/Symptoms: Your throat may feel dry or sore from the anesthesia or the breathing tube placed in your throat during surgery. If this causes discomfort, gargle with warm salt water. The discomfort should disappear within 24 hours.  If you had a scopolamine patch placed behind your ear for the management of post- operative nausea and/or vomiting:  1. The medication in the patch is effective for 72 hours, after which it should be removed.  Wrap patch in a tissue and discard in the trash. Wash hands thoroughly with soap and water. 2. You may remove the patch earlier than 72 hours if you experience unpleasant side effects which may include dry mouth, dizziness or visual disturbances. 3. Avoid touching the patch. Wash your hands with soap and water after contact with the patch.    No ibuprofen, Advil, Aleve, Motrin, ketorolac, meloxicam, or naproxen until after 12:00 pm today if needed.

## 2021-07-12 NOTE — Anesthesia Postprocedure Evaluation (Signed)
Anesthesia Post Note  Patient: Kaitlyn Jenkins  Procedure(s) Performed: DILATATION AND CURETTAGE     Patient location during evaluation: PACU Anesthesia Type: MAC Level of consciousness: awake and alert Pain management: pain level controlled Vital Signs Assessment: post-procedure vital signs reviewed and stable Respiratory status: spontaneous breathing, nonlabored ventilation, respiratory function stable and patient connected to nasal cannula oxygen Cardiovascular status: stable and blood pressure returned to baseline Postop Assessment: no apparent nausea or vomiting Anesthetic complications: no   No notable events documented.  Last Vitals:  Vitals:   07/12/21 0755 07/12/21 0800  BP:  (!) 164/90  Pulse: 75 76  Resp: 14 15  Temp: (!) 36.4 C   SpO2: 94% 93%    Last Pain:  Vitals:   07/12/21 0755  TempSrc:   PainSc: 0-No pain                 Marisah Laker S

## 2021-07-12 NOTE — H&P (Signed)
Follow-up Note: Gyn-Onc  Consult was requested by Dr. Stann Mainland for the evaluation of [name] 60 y.o. female  CC:  No chief complaint on file.    Assessment/Plan:  [name]  is a 60 y.o.  year old with a uterine mass/fibroid approximately 6cm with benign endometrial biopsy and postmenopausal bleeding since 2020.  Non diagnostic (discordant) office biopsy.  Given the persistent nature of her postmenopausal bleeding and non-diagnostic office sampling, I am recommending proceeding with D&C. If this is benign, she is electing for hysterectomy for increased growth of the fibroid (2cm in 2 years). While I continue to have a low suspicion for LMS, I think this is reasonable. I explained that hysterectomy is associated with increased risk for complications for her given her history of known pelvic adhesive disease. If her endometrial sampling shows endometrial cancer, I will recommend robotic hysterectomy, BSO and SLN biopsy. She may require minilap for specimen delivery due to her nulliparous state and 6cm fibroid. She has elected to delay her workup until after July 1st due to a change in insurance.  HPI: [name] is a 60 year old P0 who is seen in consultation at the request of Dr Stann Mainland for a uterine mass and concern for possible malignancy.  Patient began having occasional postmenopausal spotting in January 2020.  This occurred occasionally and was only spots of blood with wiping after passing urine.  She denies having to wear a pad or having heavy hemorrhage.  She was seen by her OB/GYN Dr. Stann Mainland who performed a transvaginal ultrasound scan on February 22, 2019 and this showed a uterus measuring 7.5 x 4.8 x 5 cm with an endometrium that was difficult to visualize.  There was a mass resembling a myoma measuring 4.8 cm.  The ovaries were not visualized but the adnexa appeared normal.  An endometrial biopsy was performed in the office on March 02, 2019.  This revealed rare  benign endometrial fragments fragments seen in a specimen that consists mainly of bloody mucus.  Interval Hx:  She was seen by me at that time and my recommendation was further work-up with an MRI and D&C. However, when the [place] pandemic hit, she canceled her MRI appointment and was lost to follow-up. She had no bleeding for 2 years until early May, 2022 when she began experiencing persistent daily light bleeding. She returned to see Dr Stann Mainland on 05/07/21 who performed a endometrial biopsy that showed rare inactive endometrial glands, predominantly blood, extremely scanty tissue.  Pap smear was taken however the the specimen was unsatisfactory for evaluation due to insufficient cellularity.  Transvaginal ultrasound scan was performed on 05/15/2021 and revealed a uterus measuring 6.6 x 5.8 x 7.5 cm.  To the endometrium was difficult to visualize given the overlying fibroid however the visualized portions were measured to be thickened up to 21 mm.  The right and left ovaries were grossly normal bilaterally.  There was a 6.4 cm intramural fibroid at the central aspect of the uterine fundus (this had been previously 4.8 cm).  The fibroid showed a heterogeneous make up with some Doppler signal on the periphery of the fibroid.  Her past medical history significant for hypothyroidism, and reflux disease.  Her only prior surgery was a laparoscopic tubal converted to laparotomy due to scar tissue for salpingectomy performed in 1997.  She has been pregnant once with a spontaneous AB.  She has never had childbirth.  Family history is unremarkable for breast ovarian or colon  cancers.  The patient is a smoker with a 40-pack-year history.  She denies a history of lung disease.  Current Meds:  Outpatient Encounter Medications as of 05/31/2021  Medication Sig   CALCIUM PO Take 1 tablet by mouth daily.   esomeprazole (NEXIUM 24HR) 20 MG capsule Take 20 mg by mouth every morning.   ibuprofen (ADVIL,MOTRIN) 800 MG tablet  Take 1 tablet (800 mg total) by mouth 3 (three) times daily.   levothyroxine (SYNTHROID, LEVOTHROID) 125 MCG tablet Take 125 mcg by mouth daily.   Multiple Vitamin (MULTIVITAMIN WITH MINERALS) TABS tablet Take 1 tablet by mouth daily.   Omega-3 Fatty Acids (FISH OIL PO) Take 1 capsule by mouth daily.   No facility-administered encounter medications on file as of 05/31/2021.    Allergy:  Allergies  Allergen Reactions   Codeine Nausea And Vomiting    Per patient 03-12-19    Social Hx:   Social History   Socioeconomic History   Marital status: Married    Spouse name: Not on file   Number of children: Not on file   Years of education: Not on file   Highest education level: Not on file  Occupational History   Not on file  Tobacco Use   Smoking status: Every Day    Packs/day: 1.00    Years: 38.00    Pack years: 38.00    Types: Cigarettes   Smokeless tobacco: Never  Vaping Use   Vaping Use: Never used  Substance and Sexual Activity   Alcohol use: Yes    Alcohol/week: 4.0 standard drinks    Types: 4 Cans of beer per week    Comment: 4/week   Drug use: No   Sexual activity: Not Currently  Other Topics Concern   Not on file  Social History Narrative   Not on file   Social Determinants of Health   Financial Resource Strain: Not on file  Food Insecurity: Not on file  Transportation Needs: Not on file  Physical Activity: Not on file  Stress: Not on file  Social Connections: Not on file  Intimate Partner Violence: Not on file    Past Surgical Hx:  Past Surgical History:  Procedure Laterality Date   JOINT REPLACEMENT Right    right total hip replaced in 2010.   TOTAL HIP ARTHROPLASTY      Past Medical Hx:  Past Medical History:  Diagnosis Date   Arthritis    left thumb injection for arthritis nov 2021   Elbow injury 07/06/2021   fell on july 3,2022 fell on her elbow , saw orthopedic dr on July 06, 2021, bone chipped off will need surgery   GERD (gastroesophageal  reflux disease)    Hypothyroidism    Renal stone 06/22/2013    Past Gynecological History:  G0 No LMP recorded. Patient is postmenopausal.  Family Hx: History reviewed. No pertinent family history.  Review of Systems:  Constitutional  Feels well,    ENT Normal appearing ears and nares bilaterally Skin/Breast  No rash, sores, jaundice, itching, dryness Cardiovascular  No chest pain, shortness of breath, or edema  Pulmonary  No cough or wheeze.  Gastro Intestinal  No nausea, vomitting, or diarrhoea. No bright red blood per rectum, no abdominal pain, change in bowel movement, or constipation.  Genito Urinary  No frequency, urgency, dysuria, + postmenopausal bleeding.  Musculo Skeletal  No myalgia, arthralgia, joint swelling or pain  Neurologic  No weakness, numbness, change in gait,  Psychology  No depression, anxiety,  insomnia.   Vitals:  Blood pressure (!) 162/97, pulse 73, temperature 98 F (36.7 C), temperature source Oral, resp. rate 15, height 5\' 3"  (1.6 m), weight 142 lb 14.4 oz (64.8 kg), SpO2 98 %.  Physical Exam: WD in NAD Neck  Supple NROM, without any enlargements.  Lymph Node Survey No cervical supraclavicular or inguinal adenopathy Cardiovascular  Pulse normal rate, regularity and rhythm. S1 and S2 normal.  Lungs  Clear to auscultation bilateraly, without wheezes/crackles/rhonchi. Good air movement.  Skin  No rash/lesions/breakdown  Psychiatry  Alert and oriented to person, place, and time  Abdomen  Normoactive bowel sounds, abdomen soft, non-tender and nonobese without evidence of hernia.  Back No CVA tenderness Genito Urinary  Vulva/vagina: Normal external female genitalia.  No lesions. No discharge or bleeding.  Bladder/urethra:  No lesions or masses, well supported bladder  Vagina: normal  Cervix: Normal appearing, no lesions.  Uterus:  Small, mobile, no parametrial involvement or nodularity.  Adnexa: no discrete masses. Rectal  deferred.   Extremities  No bilateral cyanosis, clubbing or edema.   Thereasa Solo, MD  07/12/2021, 7:11 AM

## 2021-07-12 NOTE — Op Note (Signed)
OPERATIVE NOTE PATIENT: Neomia Herbel DATE: 07/12/21   Preop Diagnosis: post-menopausal bleeding  Postoperative Diagnosis: same  Surgery: D&C (dilation and curettage)  Surgeons:  Donaciano Eva, MD Assistant: none  Anesthesia: General   Estimated blood loss: 84ml  IVF:  225ml   Urine output: 50 ml   Complications: None   Pathology: endometrial curetteings  Operative findings: uterus sounded to 8cm, soft endometrial cavity  Procedure: The patient was identified in the preoperative holding area. Informed consent was signed on the chart. Patient was seen history was reviewed and exam was performed.   The patient was then taken to the operating room and placed in the supine position with SCD hose on. General anesthesia was then induced without difficulty. She was then placed in the dorsolithotomy position. The perineum was prepped with Betadine. The vagina was prepped with Betadine. The patient was then draped after the prep was dried. A red rubber catheter to empty the bladder was performed under aseptic conditions.  Timeout was performed the patient, procedure, antibiotic, allergy, and length of procedure.   The weighted speculum was placed in the posterior vagina. The single tooth tenaculum was placed on the anterior lip of the cervix. The uterine sound was placed in the cervix and advanced to the fundus. The cervix was successively dilated using pratts dilators to 31. A sharp curette was advanced to the fundus and a comprehensive curette of the endometrial cavity took place until a gritty feel was appreciated. The specimen was collected on a telfa and sent for permanent pathology.  The tenaculum was removed and hemostasis was observed.   The vagina was irrigated.  All instrument, suture, laparotomy, Ray-Tec, and needle counts were correct x2. The patient tolerated the procedure well and was taken recovery room in stable condition. This is Everitt Amber  dictating an operative

## 2021-07-13 ENCOUNTER — Encounter (HOSPITAL_BASED_OUTPATIENT_CLINIC_OR_DEPARTMENT_OTHER): Payer: Self-pay | Admitting: Gynecologic Oncology

## 2021-07-13 ENCOUNTER — Telehealth: Payer: Self-pay

## 2021-07-13 LAB — SURGICAL PATHOLOGY

## 2021-07-13 NOTE — Telephone Encounter (Signed)
LM for Ms Wroblewski to call the office to discuss how she is doing after her surgery  and to give her her post op appointment on 07-18-21 at 3 pm with Dr. Denman George.

## 2021-07-13 NOTE — Telephone Encounter (Signed)
Spoke with [name].  She states she is eating, drinking and urinating well. She has had a couple of BM today. Encouraged her to drink plenty of water. She denies fever or chills. Instructed to call office with any fever, chills, vaginal bleeding like a period,uncontrolled pain or any other questions or concerns. Patient verbalizes understanding.  Pt aware of post op appointments as well as the office number [phone] and after hours number [phone] to call if she has any questions or concerns   Told Ms Jenkins that the surgical pathology from the surgery 07-12-21 showed no cancer per Kaitlyn John, NP. Dr. Denman George will discuss in detail her recommendations at her post op visit 07-24-21.

## 2021-07-17 DIAGNOSIS — T8859XA Other complications of anesthesia, initial encounter: Secondary | ICD-10-CM

## 2021-07-17 HISTORY — PX: ELBOW SURGERY: SHX618

## 2021-07-17 HISTORY — DX: Other complications of anesthesia, initial encounter: T88.59XA

## 2021-07-17 HISTORY — PX: OTHER SURGICAL HISTORY: SHX169

## 2021-07-18 ENCOUNTER — Ambulatory Visit: Payer: No Typology Code available for payment source | Admitting: Gynecologic Oncology

## 2021-07-24 ENCOUNTER — Other Ambulatory Visit: Payer: Self-pay

## 2021-07-24 ENCOUNTER — Encounter: Payer: Self-pay | Admitting: Gynecologic Oncology

## 2021-07-24 ENCOUNTER — Inpatient Hospital Stay: Payer: No Typology Code available for payment source | Attending: Gynecologic Oncology | Admitting: Gynecologic Oncology

## 2021-07-24 VITALS — BP 149/74 | HR 77 | Temp 98.1°F | Resp 18 | Ht 63.0 in | Wt 140.6 lb

## 2021-07-24 DIAGNOSIS — D251 Intramural leiomyoma of uterus: Secondary | ICD-10-CM | POA: Insufficient documentation

## 2021-07-24 DIAGNOSIS — K219 Gastro-esophageal reflux disease without esophagitis: Secondary | ICD-10-CM | POA: Insufficient documentation

## 2021-07-24 DIAGNOSIS — N950 Postmenopausal bleeding: Secondary | ICD-10-CM | POA: Insufficient documentation

## 2021-07-24 DIAGNOSIS — E039 Hypothyroidism, unspecified: Secondary | ICD-10-CM | POA: Diagnosis not present

## 2021-07-24 DIAGNOSIS — F17210 Nicotine dependence, cigarettes, uncomplicated: Secondary | ICD-10-CM | POA: Insufficient documentation

## 2021-07-24 MED ORDER — TRAMADOL HCL 50 MG PO TABS: 50.0000 mg | ORAL_TABLET | Freq: Four times a day (QID) | ORAL | 0 refills | Status: DC | PRN

## 2021-07-24 MED ORDER — SENNOSIDES-DOCUSATE SODIUM 8.6-50 MG PO TABS: 2.0000 | ORAL_TABLET | Freq: Every day | ORAL | 0 refills | Status: DC

## 2021-07-24 MED ORDER — IBUPROFEN 800 MG PO TABS: 800.0000 mg | ORAL_TABLET | Freq: Three times a day (TID) | ORAL | 0 refills | Status: DC | PRN

## 2021-07-24 NOTE — Progress Notes (Signed)
Follow-up Note: Gyn-Onc  Consult was requested by Dr. Stann Mainland for the evaluation of Kaitlyn Jenkins 60 y.o. female  CC:  Chief Complaint  Patient presents with   Fibroids, intramural     Assessment/Plan:  [name]  is a 60 y.o.  year old with a uterine mass/fibroid approximately 6cm with benign endometrial biopsy and postmenopausal bleeding since 2020.  Endometrium benign on sampling  Given the persistent nature of her postmenopausal bleeding and subtle fibroid growth over time, I am recommending proceeding with robotic assisted total hysterectomy and BSO. While I continue to have a low suspicion for LMS, I think this is reasonable. I explained that hysterectomy is associated with increased risk for complications for her given her history of known pelvic adhesive disease. She may require minilap for specimen delivery due to her nulliparous state and 6cm uterine fibroid. We counseled her regarding surgery, and the need for postoperative recovery, 2 weeks off of in person work, return to at home work at 2 weeks, in person work at 4 weeks.   HPI: [name] is a 60 year old P0 who is seen in consultation at the request of Dr Stann Mainland for a uterine mass and concern for possible malignancy.  Patient began having occasional postmenopausal spotting in January 2020.  This occurred occasionally and was only spots of blood with wiping after passing urine.  She denies having to wear a pad or having heavy hemorrhage.  She was seen by her OB/GYN Dr. Stann Mainland who performed a transvaginal ultrasound scan on February 22, 2019 and this showed a uterus measuring 7.5 x 4.8 x 5 cm with an endometrium that was difficult to visualize.  There was a mass resembling a myoma measuring 4.8 cm.  The ovaries were not visualized but the adnexa appeared normal.  An endometrial biopsy was performed in the office on March 02, 2019.  This revealed rare benign endometrial fragments fragments seen in a specimen that  consists mainly of bloody mucus.  Interval Hx:  She was seen by me at that time and my recommendation was further work-up with an MRI and D&C. However, when the [place] pandemic hit, she canceled her MRI appointment and was lost to follow-up. She had no bleeding for 2 years until early May, 2022 when she began experiencing persistent daily light bleeding. She returned to see Dr Stann Mainland on 05/07/21 who performed a endometrial biopsy that showed rare inactive endometrial glands, predominantly blood, extremely scanty tissue.  Pap smear was taken however the the specimen was unsatisfactory for evaluation due to insufficient cellularity.  Transvaginal ultrasound scan was performed on 05/15/2021 and revealed a uterus measuring 6.6 x 5.8 x 7.5 cm.  To the endometrium was difficult to visualize given the overlying fibroid however the visualized portions were measured to be thickened up to 21 mm.  The right and left ovaries were grossly normal bilaterally.  There was a 6.4 cm intramural fibroid at the central aspect of the uterine fundus (this had been previously 4.8 cm).  The fibroid showed a heterogeneous make up with some Doppler signal on the periphery of the fibroid.  Interval Hx:   On 07/12/2021 she underwent a D&C procedure.  Intraoperative findings was significant for uterus sounded to 8 cm.  Pathology revealed superficial fragments of inactive endometrium with no malignancy or dysplasia identified.  She has had continued light bleeding since this procedure.  Current Meds:  Outpatient Encounter Medications as of 07/24/2021  Medication Sig   ibuprofen (ADVIL)  800 MG tablet Take 1 tablet (800 mg total) by mouth every 8 (eight) hours as needed for moderate pain. For AFTER surgery only   senna-docusate (SENOKOT-S) 8.6-50 MG tablet Take 2 tablets by mouth at bedtime. For AFTER surgery, do not take if having diarrhea   traMADol (ULTRAM) 50 MG tablet Take 1 tablet (50 mg total) by mouth every 6 (six) hours as needed  for severe pain. For AFTER surgery, do not take and drive   esomeprazole (NEXIUM) 20 MG capsule Take 20 mg by mouth every morning.   levothyroxine (SYNTHROID, LEVOTHROID) 125 MCG tablet Take 125 mcg by mouth daily.   Multiple Vitamin (MULTIVITAMIN WITH MINERALS) TABS tablet Take 1 tablet by mouth daily.   Omega-3 Fatty Acids (FISH OIL PO) Take 1 capsule by mouth daily. Stopped taking and will start after her surgery after checking with her dr. 07/06/21   No facility-administered encounter medications on file as of 07/24/2021.    Allergy:  Allergies  Allergen Reactions   Codeine Nausea And Vomiting    Per patient 03-12-19    Social Hx:   Social History   Socioeconomic History   Marital status: Married    Spouse name: Not on file   Number of children: Not on file   Years of education: Not on file   Highest education level: Not on file  Occupational History   Not on file  Tobacco Use   Smoking status: Every Day    Packs/day: 1.00    Years: 38.00    Pack years: 38.00    Types: Cigarettes   Smokeless tobacco: Never  Vaping Use   Vaping Use: Never used  Substance and Sexual Activity   Alcohol use: Yes    Alcohol/week: 4.0 standard drinks    Types: 4 Cans of beer per week    Comment: 4/week   Drug use: No   Sexual activity: Not Currently  Other Topics Concern   Not on file  Social History Narrative   Not on file   Social Determinants of Health   Financial Resource Strain: Not on file  Food Insecurity: Not on file  Transportation Needs: Not on file  Physical Activity: Not on file  Stress: Not on file  Social Connections: Not on file  Intimate Partner Violence: Not on file    Past Surgical Hx:  Past Surgical History:  Procedure Laterality Date   DILATION AND CURETTAGE OF UTERUS N/A 07/12/2021   Procedure: DILATATION AND CURETTAGE;  Surgeon: Everitt Amber, MD;  Location: [facility];  Service: Gynecology;  Laterality: N/A;   JOINT REPLACEMENT Right     right total hip replaced in 2010.   TOTAL HIP ARTHROPLASTY      Past Medical Hx:  Past Medical History:  Diagnosis Date   Arthritis    left thumb injection for arthritis nov 2021   Elbow injury 07/06/2021   fell on july 3,2022 fell on her elbow , saw orthopedic dr on July 06, 2021, bone chipped off will need surgery   GERD (gastroesophageal reflux disease)    Hypothyroidism    Renal stone 06/22/2013    Past Gynecological History:  G0 No LMP recorded. Patient is postmenopausal.  Family Hx: History reviewed. No pertinent family history.  Review of Systems:  Constitutional  Feels well,    ENT Normal appearing ears and nares bilaterally Skin/Breast  No rash, sores, jaundice, itching, dryness Cardiovascular  No chest pain, shortness of breath, or edema  Pulmonary  No cough  or wheeze.  Gastro Intestinal  No nausea, vomitting, or diarrhoea. No bright red blood per rectum, no abdominal pain, change in bowel movement, or constipation.  Genito Urinary  No frequency, urgency, dysuria, + postmenopausal bleeding.  Musculo Skeletal  No myalgia, arthralgia, joint swelling or pain  Neurologic  No weakness, numbness, change in gait,  Psychology  No depression, anxiety, insomnia.   Vitals:  Blood pressure (!) 149/74, pulse 77, temperature 98.1 F (36.7 C), temperature source Oral, resp. rate 18, height '5\' 3"'$  (1.6 m), weight 140 lb 9 oz (63.8 kg), SpO2 97 %.  Physical Exam: WD in NAD Neck  Supple NROM, without any enlargements.  Lymph Node Survey No cervical supraclavicular or inguinal adenopathy Cardiovascular  Pulse normal rate, regularity and rhythm. S1 and S2 normal.  Lungs  Clear to auscultation bilateraly, without wheezes/crackles/rhonchi. Good air movement.  Skin  No rash/lesions/breakdown  Psychiatry  Alert and oriented to person, place, and time  Abdomen  Normoactive bowel sounds, abdomen soft, non-tender and nonobese without evidence of hernia.  Back No CVA  tenderness Genito Urinary  Vulva/vagina: Normal external female genitalia.  No lesions. No discharge or bleeding.  Bladder/urethra:  No lesions or masses, well supported bladder  Vagina: normal  Cervix: Normal appearing, no lesions.  Uterus:  Small, mobile, no parametrial involvement or nodularity.  Adnexa: no discrete masses. Rectal  deferred.  Extremities  No bilateral cyanosis, clubbing or edema.   Thereasa Solo, MD  07/24/2021, 6:31 PM

## 2021-07-24 NOTE — Patient Instructions (Signed)
Preparing for your Surgery  Plan for surgery on September 11, 2021 (or August 29, 2021 if the time becomes available) with Dr. Everitt Amber at [facility] ([place] if on August 29, 2021). You will be scheduled for a robotic assisted total laparoscopic hysterectomy (removal of the uterus and cervix), bilateral salpingo-oophorectomy (removal of both ovaries and fallopian tubes), possible mini laparotomy (larger incision on your abdomen if needed).   Pre-operative Testing -You will receive a phone call from presurgical testing at [facility] or [facility] to arrange for a pre-operative appointment and lab work.  -Bring your insurance card, copy of an advanced directive if applicable, medication list  -At that visit, you will be asked to sign a consent for a possible blood transfusion in case a transfusion becomes necessary during surgery.  The need for a blood transfusion is rare but having consent is a necessary part of your care.     -You should not be taking blood thinners or aspirin at least ten days prior to surgery unless instructed by your surgeon.  -Do not take supplements such as fish oil (omega 3), red yeast rice, turmeric before your surgery. You want to avoid medications with aspirin in them including headache powders such as BC or Goody's), Excedrin migraine.  Day Before Surgery at [place] will be asked to take in a light diet the day before surgery. You will be advised you can have clear liquids up until 3 hours before your surgery.    Eat a light diet the day before surgery.  Examples including soups, broths, toast, yogurt, mashed potatoes.  AVOID GAS PRODUCING FOODS. Things to avoid include carbonated beverages (fizzy beverages, sodas), raw fruits and raw vegetables (uncooked), or beans.   If your bowels are filled with gas, your surgeon will have difficulty visualizing your pelvic organs which increases your surgical  risks.  Your role in recovery Your role is to become active as soon as directed by your doctor, while still giving yourself time to heal.  Rest when you feel tired. You will be asked to do the following in order to speed your recovery:  - Cough and breathe deeply. This helps to clear and expand your lungs and can prevent pneumonia after surgery.  - [place]. Do mild physical activity. Walking or moving your legs help your circulation and body functions return to normal. Do not try to get up or walk alone the first time after surgery.   -If you develop swelling on one leg or the other, pain in the back of your leg, redness/warmth in one of your legs, please call the office or go to the Emergency Room to have a doppler to rule out a blood clot. For shortness of breath, chest pain-seek care in the Emergency Room as soon as possible. - Actively manage your pain. Managing your pain lets you move in comfort. We will ask you to rate your pain on a scale of zero to 10. It is your responsibility to tell your doctor or nurse where and how much you hurt so your pain can be treated.  Special Considerations -Your final pathology results from surgery should be available around one week after surgery and the results will be relayed to you when available.  -FMLA forms can be faxed to [phone] and please allow 5-7 business days for completion.  Pain Management After Surgery -You have been prescribed your pain medication and bowel regimen medications  before surgery so that you can have these available when you are discharged from the hospital. The pain medication is for use ONLY AFTER surgery and a new prescription will not be given.   -Make sure that you have Tylenol and Ibuprofen at home to use on a regular basis after surgery for pain control. We recommend alternating the medications every hour to six hours since they work differently and are processed in the body differently for pain  relief.  -Review the attached handout on narcotic use and their risks and side effects.   Bowel Regimen -You have been prescribed Sennakot-S to take nightly to prevent constipation especially if you are taking the narcotic pain medication intermittently.  It is important to prevent constipation and drink adequate amounts of liquids. You can stop taking this medication when you are not taking pain medication and you are back on your normal bowel routine.  Risks of Surgery Risks of surgery are low but include bleeding, infection, damage to surrounding structures, re-operation, blood clots, and very rarely death.   Blood Transfusion Information (For the consent to be signed before surgery)  We will be checking your blood type before surgery so in case of emergencies, we will know what type of blood you would need.                                            WHAT IS A BLOOD TRANSFUSION?  A transfusion is the replacement of blood or some of its parts. Blood is made up of multiple cells which provide different functions. Red blood cells carry oxygen and are used for blood loss replacement. White blood cells fight against infection. Platelets control bleeding. Plasma helps clot blood. Other blood products are available for specialized needs, such as hemophilia or other clotting disorders. BEFORE THE TRANSFUSION  Who gives blood for transfusions?  You may be able to donate blood to be used at a later date on yourself (autologous donation). Relatives can be asked to donate blood. This is generally not any safer than if you have received blood from a stranger. The same precautions are taken to ensure safety when a relative's blood is donated. Healthy volunteers who are fully evaluated to make sure their blood is safe. This is blood bank blood. Transfusion therapy is the safest it has ever been in the practice of medicine. Before blood is taken from a donor, a complete history is taken to make sure  that person has no history of diseases nor engages in risky social behavior (examples are intravenous drug use or sexual activity with multiple partners). The donor's travel history is screened to minimize risk of transmitting infections, such as malaria. The donated blood is tested for signs of infectious diseases, such as HIV and hepatitis. The blood is then tested to be sure it is compatible with you in order to minimize the chance of a transfusion reaction. If you or a relative donates blood, this is often done in anticipation of surgery and is not appropriate for emergency situations. It takes many days to process the donated blood. RISKS AND COMPLICATIONS Although transfusion therapy is very safe and saves many lives, the main dangers of transfusion include:  Getting an infectious disease. Developing a transfusion reaction. This is an allergic reaction to something in the blood you were given. Every precaution is taken to prevent this. The decision to  have a blood transfusion has been considered carefully by your caregiver before blood is given. Blood is not given unless the benefits outweigh the risks.  AFTER SURGERY INSTRUCTIONS  Return to work: 4 weeks if applicable  Activity: 1. Be up and out of the bed during the day.  Take a nap if needed.  You may walk up steps but be careful and use the hand rail.  Stair climbing will tire you more than you think, you may need to stop part way and rest.   2. No lifting or straining for 6 weeks over 10 pounds. No pushing, pulling, straining for 6 weeks.  3. No driving for 1 week(s).  Do not drive if you are taking narcotic pain medicine and make sure that your reaction time has returned.   4. You can shower as soon as the next day after surgery. Shower daily.  Use your regular soap and water (not directly on the incision) and pat your incision(s) dry afterwards; don't rub.  No tub baths or submerging your body in water until cleared by your surgeon. If  you have the soap that was given to you by pre-surgical testing that was used before surgery, you do not need to use it afterwards because this can irritate your incisions.   5. No sexual activity and nothing in the vagina for 8 weeks.  6. You may experience a small amount of clear drainage from your incisions, which is normal.  If the drainage persists, increases, or changes color please call the office.  7. Do not use creams, lotions, or ointments such as neosporin on your incisions after surgery until advised by your surgeon because they can cause removal of the dermabond glue on your incisions.    8. You may experience vaginal spotting after surgery or around the 6-8 week mark from surgery when the stitches at the top of the vagina begin to dissolve.  The spotting is normal but if you experience heavy bleeding, call our office.  9. Take Tylenol or ibuprofen first for pain and only use narcotic pain medication for severe pain not relieved by the Tylenol or Ibuprofen.  Monitor your Tylenol intake to a max of 4,000 mg in a 24 hour period. You can alternate these medications after surgery.  Diet: 1. Low sodium Heart Healthy Diet is recommended but you are cleared to resume your normal (before surgery) diet after your procedure.  2. It is safe to use a laxative, such as Miralax or Colace, if you have difficulty moving your bowels. You have been prescribed Sennakot at bedtime every evening to keep bowel movements regular and to prevent constipation.    Wound Care: 1. Keep clean and dry.  Shower daily.  Reasons to call the Doctor: Fever - Oral temperature greater than 100.4 degrees Fahrenheit Foul-smelling vaginal discharge Difficulty urinating Nausea and vomiting Increased pain at the site of the incision that is unrelieved with pain medicine. Difficulty breathing with or without chest pain New calf pain especially if only on one side Sudden, continuing increased vaginal bleeding with or  without clots.   Contacts: For questions or concerns you should contact:  Dr. Everitt Amber at [phone]  Joylene John, NP at [phone]  After Hours: call [phone] and have the GYN Oncologist paged/contacted (after 5 pm or on the weekends).  Messages sent via mychart are for non-urgent matters and are not responded to after hours so for urgent needs, please call the after hours number.

## 2021-07-27 ENCOUNTER — Other Ambulatory Visit: Payer: Self-pay | Admitting: Gynecologic Oncology

## 2021-07-27 ENCOUNTER — Inpatient Hospital Stay: Payer: No Typology Code available for payment source | Admitting: Gynecologic Oncology

## 2021-07-27 DIAGNOSIS — D251 Intramural leiomyoma of uterus: Secondary | ICD-10-CM

## 2021-07-31 ENCOUNTER — Telehealth: Payer: Self-pay

## 2021-07-31 NOTE — Telephone Encounter (Signed)
[name] called and stated that she was fine with the new surgery date and time as noted below.

## 2021-07-31 NOTE — Telephone Encounter (Signed)
LM for Kaitlyn Jenkins stating that her surgery has been moved up to 08-29-21 at 0830 at the [facility]. A nurse from the surgery center will call you a day or two prior  to your surgery to go over instructions. Please contact the office at [phone] to notify us of receipt of this message.

## 2021-08-23 ENCOUNTER — Encounter (HOSPITAL_BASED_OUTPATIENT_CLINIC_OR_DEPARTMENT_OTHER): Payer: Self-pay | Admitting: Gynecologic Oncology

## 2021-08-23 ENCOUNTER — Other Ambulatory Visit: Payer: Self-pay

## 2021-08-23 DIAGNOSIS — Z972 Presence of dental prosthetic device (complete) (partial): Secondary | ICD-10-CM

## 2021-08-23 DIAGNOSIS — Z973 Presence of spectacles and contact lenses: Secondary | ICD-10-CM

## 2021-08-23 HISTORY — DX: Presence of dental prosthetic device (complete) (partial): Z97.2

## 2021-08-23 HISTORY — DX: Presence of spectacles and contact lenses: Z97.3

## 2021-08-23 NOTE — Progress Notes (Signed)
Spoke w/ via phone for pre-op interview---pt Lab needs dos----   none            Lab results------lab appt 830 am 08-29-2021 for cbc bmp t & s ua COVID test -----patient states asymptomatic no test needed Arrive at -------900 am 08-29-2021 NPO after MN NO Solid Food.  Clear liquids from MN until---800 am Med rec completed Medications to take morning of surgery -----nexium levothyroxine Diabetic medication -----n/a Patient instructed no nail polish to be worn day of surgery Patient instructed to bring photo id and insurance card day of surgery Patient aware to have Driver (ride ) / caregiver  spouse greg kelly   for 24 hours after surgery  Patient Special Instructions -----light diet day before surgery Pre-Op special Istructions -----no smoking 24 hours before surgery Patient verbalized understanding of instructions that were given at this phone interview. Patient denies shortness of breath, chest pain, fever, cough at this phone interview.   Anesthesia  record and note from surgical center of [facility] surgery 07-17-2021 surgery on chart, O2 sats dropped to 88 to 95 % on oxygen and room air, sats improved over time and pt discharged home same day

## 2021-08-23 NOTE — Progress Notes (Addendum)
Your procedure is scheduled on  08-29-2021  Report to [facility]   Call this number if you have problems the morning of surgery  :[phone].   OUR ADDRESS IS [address].  WE ARE LOCATED IN [facility].  PLEASE BRING YOUR INSURANCE CARD AND PHOTO ID DAY OF SURGERY.  ONLY ONE PERSON ALLOWED IN FACILITY WAITING AREA.                                     REMEMBER:  Eat a light diet the day before surgery.  Examples including soups, broths, toast, yogurt, mashed potatoes.  Things to avoid include carbonated beverages (fizzy beverages), raw fruits and raw vegetables, or beans.   If your bowels are filled with gas, your surgeon will have difficulty visualizing your pelvic organs which increases your surgical risks.     DO NOT EAT FOOD, CANDY GUM OR MINTS  AFTER MIDNIGHT . YOU MAY HAVE CLEAR LIQUIDS FROM MIDNIGHT UNTIL 800 AM. NO CLEAR LIQUIDS AFTER 800 AM DAY OF SURGERY.   YOU MAY  BRUSH YOUR TEETH MORNING OF SURGERY AND RINSE YOUR MOUTH OUT, NO CHEWING GUM CANDY OR MINTS.    CLEAR LIQUID DIET   Foods Allowed                                                                   Coffee and tea, regular and liquids that you  Plain Jell-O any favor except red or purple                                           Iced Popsicles                                     Cranberry, grape and apple juices Sports drinks like Gatorade Lightly seasoned clear broth or consume(fat free) Sugar  Sample Menu Breakfast                                Lunch                                     Supper Cranberry juice                    Beef broth                            Chicken broth Jell-O                                     Grape juice  Apple juice Coffee or tea                        Jell-O                                      Popsicle                                                Coffee or tea                        Coffee  or tea  _____________________________________________________________________     TAKE THESE MEDICATIONS MORNING OF SURGERY WITH A SIP OF WATER:  NEXIUM, LEVOTHYROXINE  ONE VISITOR IS ALLOWED IN WAITING ROOM ONLY DAY OF SURGERY.  NO VISITOR MAY SPEND THE NIGHT.  VISITOR ARE ALLOWED TO STAY UNTIL 800 PM.                                    DO NOT WEAR JEWERLY, MAKE UP. DO NOT WEAR LOTIONS, POWDERS, PERFUMES OR NAIL POLISH. DO NOT SHAVE FOR 48 HOURS PRIOR TO DAY OF SURGERY. MEN MAY SHAVE FACE AND NECK. CONTACTS, GLASSES, OR DENTURES MAY NOT BE WORN TO SURGERY.                                    [facility] IS NOT RESPONSIBLE  FOR ANY BELONGINGS.                                                                    Marland Kitchen           [facility] - Preparing for Surgery Before surgery, you can play an important role.  Because skin is not sterile, your skin needs to be as free of germs as possible.  You can reduce the number of germs on your skin by washing with CHG (chlorahexidine gluconate) soap before surgery.  CHG is an antiseptic cleaner which kills germs and bonds with the skin to continue killing germs even after washing. Please DO NOT use if you have an allergy to CHG or antibacterial soaps.  If your skin becomes reddened/irritated stop using the CHG and inform your nurse when you arrive at Short Stay. Do not shave (including legs and underarms) for at least 48 hours prior to the first CHG shower.  You may shave your face/neck. Please follow these instructions carefully:  1.  Shower with CHG Soap the night before surgery and the  morning of Surgery.  2.  If you choose to wash your hair, wash your hair first as usual with your  normal  shampoo.  3.  After you shampoo, rinse your hair and body thoroughly to remove the  shampoo.  4.  Use CHG as you would any other liquid soap.  You can apply chg directly  to the skin and wash                      Gently with a scrungie or  clean washcloth.  5.  Apply the CHG Soap to your body ONLY FROM THE NECK DOWN.   Do not use on face/ open                           Wound or open sores. Avoid contact with eyes, ears mouth and genitals (private parts).                       Wash face,  Genitals (private parts) with your normal soap.             6.  Wash thoroughly, paying special attention to the area where your surgery  will be performed.  7.  Thoroughly rinse your body with warm water from the neck down.  8.  DO NOT shower/wash with your normal soap after using and rinsing off  the CHG Soap.                9.  Pat yourself dry with a clean towel.            10.  Wear clean pajamas.            11.  Place clean sheets on your bed the night of your first shower and do not  sleep with pets. Day of Surgery : Do not apply any lotions/deodorants the morning of surgery.  Please wear clean clothes to the hospital/surgery center.  FAILURE TO FOLLOW THESE INSTRUCTIONS MAY RESULT IN THE CANCELLATION OF YOUR SURGERY PATIENT SIGNATURE_________________________________  NURSE SIGNATURE__________________________________  ________________________________________________________________________                                                        QUESTIONS Hansel Feinstein PRE OP NURSE PHONE [phone].

## 2021-08-28 ENCOUNTER — Telehealth: Payer: Self-pay

## 2021-08-28 ENCOUNTER — Encounter (HOSPITAL_COMMUNITY): Payer: No Typology Code available for payment source

## 2021-08-28 ENCOUNTER — Other Ambulatory Visit: Payer: Self-pay

## 2021-08-28 ENCOUNTER — Encounter (HOSPITAL_COMMUNITY)
Admission: RE | Admit: 2021-08-28 | Discharge: 2021-08-28 | Disposition: A | Discharge status: Home / Self Care | Payer: No Typology Code available for payment source | Source: Ambulatory Visit | Attending: Gynecologic Oncology | Admitting: Gynecologic Oncology

## 2021-08-28 DIAGNOSIS — Z885 Allergy status to narcotic agent status: Secondary | ICD-10-CM | POA: Diagnosis not present

## 2021-08-28 DIAGNOSIS — F17210 Nicotine dependence, cigarettes, uncomplicated: Secondary | ICD-10-CM | POA: Diagnosis not present

## 2021-08-28 DIAGNOSIS — Z79899 Other long term (current) drug therapy: Secondary | ICD-10-CM | POA: Diagnosis not present

## 2021-08-28 DIAGNOSIS — Z79890 Hormone replacement therapy (postmenopausal): Secondary | ICD-10-CM | POA: Diagnosis not present

## 2021-08-28 DIAGNOSIS — Z791 Long term (current) use of non-steroidal anti-inflammatories (NSAID): Secondary | ICD-10-CM | POA: Diagnosis not present

## 2021-08-28 DIAGNOSIS — D251 Intramural leiomyoma of uterus: Secondary | ICD-10-CM | POA: Diagnosis not present

## 2021-08-28 DIAGNOSIS — Z01812 Encounter for preprocedural laboratory examination: Secondary | ICD-10-CM | POA: Diagnosis present

## 2021-08-28 DIAGNOSIS — D259 Leiomyoma of uterus, unspecified: Secondary | ICD-10-CM | POA: Diagnosis present

## 2021-08-28 DIAGNOSIS — N9489 Other specified conditions associated with female genital organs and menstrual cycle: Secondary | ICD-10-CM | POA: Diagnosis not present

## 2021-08-28 DIAGNOSIS — K660 Peritoneal adhesions (postprocedural) (postinfection): Secondary | ICD-10-CM | POA: Diagnosis not present

## 2021-08-28 LAB — URINALYSIS, ROUTINE W REFLEX MICROSCOPIC
Bilirubin Urine: NEGATIVE
Glucose, UA: NEGATIVE mg/dL
Ketones, ur: NEGATIVE mg/dL
Nitrite: NEGATIVE
Protein, ur: NEGATIVE mg/dL
Specific Gravity, Urine: 1.025 (ref 1.005–1.030)
WBC, UA: >50 WBC/hpf — ABNORMAL HIGH (ref 0–5)
pH: 6 (ref 5.0–8.0)

## 2021-08-28 LAB — COMPREHENSIVE METABOLIC PANEL
ALT: 26 U/L (ref 0–44)
AST: 27 U/L (ref 15–41)
Albumin: 4 g/dL (ref 3.5–5.0)
Alkaline Phosphatase: 99 U/L (ref 38–126)
Anion gap: 9 (ref 5–15)
BUN: 7 mg/dL (ref 6–20)
CO2: 23 mmol/L (ref 22–32)
Calcium: 9.8 mg/dL (ref 8.9–10.3)
Chloride: 108 mmol/L (ref 98–111)
Creatinine, Ser: 0.51 mg/dL (ref 0.44–1.00)
GFR, Estimated: >60 mL/min (ref >60)
Glucose, Bld: 102 mg/dL — ABNORMAL HIGH (ref 70–99)
Potassium: 3.8 mmol/L (ref 3.5–5.1)
Sodium: 140 mmol/L (ref 135–145)
Total Bilirubin: 1 mg/dL (ref 0.3–1.2)
Total Protein: 7.5 g/dL (ref 6.5–8.1)

## 2021-08-28 LAB — CBC
HCT: 45.6 % (ref 36.0–46.0)
Hemoglobin: 16.5 g/dL — ABNORMAL HIGH (ref 12.0–15.0)
MCH: 37.7 pg — ABNORMAL HIGH (ref 26.0–34.0)
MCHC: 36.2 g/dL — ABNORMAL HIGH (ref 30.0–36.0)
MCV: 104.1 fL — ABNORMAL HIGH (ref 80.0–100.0)
Platelets: 265 10*3/uL (ref 150–400)
RBC: 4.38 MIL/uL (ref 3.87–5.11)
RDW: 12.1 % (ref 11.5–15.5)
WBC: 6.5 10*3/uL (ref 4.0–10.5)
nRBC: 0 % (ref 0.0–0.2)

## 2021-08-28 NOTE — Telephone Encounter (Signed)
Telephone call to check on pre-operative status.  Patient compliant with pre-operative instructions.  Reinforced NPO after midnight.  No questions or concerns voiced.  Instructed to call for any needs.   

## 2021-08-29 ENCOUNTER — Encounter (HOSPITAL_BASED_OUTPATIENT_CLINIC_OR_DEPARTMENT_OTHER): Payer: Self-pay | Admitting: Gynecologic Oncology

## 2021-08-29 ENCOUNTER — Ambulatory Visit (HOSPITAL_BASED_OUTPATIENT_CLINIC_OR_DEPARTMENT_OTHER): Payer: No Typology Code available for payment source | Admitting: Anesthesiology

## 2021-08-29 ENCOUNTER — Encounter (HOSPITAL_BASED_OUTPATIENT_CLINIC_OR_DEPARTMENT_OTHER)
Admission: RE | Disposition: A | Discharge status: Home / Self Care | Payer: Self-pay | Source: Home / Self Care | Attending: Gynecologic Oncology

## 2021-08-29 ENCOUNTER — Other Ambulatory Visit: Payer: Self-pay

## 2021-08-29 ENCOUNTER — Ambulatory Visit (HOSPITAL_COMMUNITY)
Admission: RE | Admit: 2021-08-29 | Discharge: 2021-08-29 | Disposition: A | Discharge status: Home / Self Care | Payer: No Typology Code available for payment source | Attending: Gynecologic Oncology | Admitting: Gynecologic Oncology

## 2021-08-29 DIAGNOSIS — K660 Peritoneal adhesions (postprocedural) (postinfection): Secondary | ICD-10-CM | POA: Insufficient documentation

## 2021-08-29 DIAGNOSIS — Z885 Allergy status to narcotic agent status: Secondary | ICD-10-CM | POA: Insufficient documentation

## 2021-08-29 DIAGNOSIS — N950 Postmenopausal bleeding: Secondary | ICD-10-CM | POA: Diagnosis present

## 2021-08-29 DIAGNOSIS — Z79899 Other long term (current) drug therapy: Secondary | ICD-10-CM | POA: Insufficient documentation

## 2021-08-29 DIAGNOSIS — Z79890 Hormone replacement therapy (postmenopausal): Secondary | ICD-10-CM | POA: Insufficient documentation

## 2021-08-29 DIAGNOSIS — F17210 Nicotine dependence, cigarettes, uncomplicated: Secondary | ICD-10-CM | POA: Insufficient documentation

## 2021-08-29 DIAGNOSIS — D251 Intramural leiomyoma of uterus: Secondary | ICD-10-CM | POA: Diagnosis not present

## 2021-08-29 DIAGNOSIS — Z791 Long term (current) use of non-steroidal anti-inflammatories (NSAID): Secondary | ICD-10-CM | POA: Insufficient documentation

## 2021-08-29 DIAGNOSIS — N9489 Other specified conditions associated with female genital organs and menstrual cycle: Secondary | ICD-10-CM | POA: Insufficient documentation

## 2021-08-29 HISTORY — DX: Leiomyoma of uterus, unspecified: D25.9

## 2021-08-29 HISTORY — DX: Personal history of urinary calculi: Z87.442

## 2021-08-29 HISTORY — DX: Postmenopausal bleeding: N95.0

## 2021-08-29 HISTORY — PX: ROBOTIC ASSISTED TOTAL HYSTERECTOMY WITH BILATERAL SALPINGO OOPHERECTOMY: SHX6086

## 2021-08-29 LAB — TYPE AND SCREEN
ABO/RH(D): B POS
Antibody Screen: NEGATIVE

## 2021-08-29 SURGERY — HYSTERECTOMY, TOTAL, ROBOT-ASSISTED, LAPAROSCOPIC, WITH BILATERAL SALPINGO-OOPHORECTOMY
Anesthesia: General | Site: Abdomen | Laterality: Bilateral

## 2021-08-29 MED ORDER — FENTANYL CITRATE (PF) 100 MCG/2ML IJ SOLN
25.0000 ug | INTRAMUSCULAR | Status: DC | PRN
  Administered 2021-08-29: 50 ug via INTRAVENOUS
  Administered 2021-08-29: 25 ug via INTRAVENOUS

## 2021-08-29 MED ORDER — PHENYLEPHRINE 40 MCG/ML (10ML) SYRINGE FOR IV PUSH (FOR BLOOD PRESSURE SUPPORT)
PREFILLED_SYRINGE | INTRAVENOUS | Status: AC
  Filled 2021-08-29: qty 10

## 2021-08-29 MED ORDER — FENTANYL CITRATE (PF) 100 MCG/2ML IJ SOLN
INTRAMUSCULAR | Status: AC
  Filled 2021-08-29: qty 2

## 2021-08-29 MED ORDER — ACETAMINOPHEN 500 MG PO TABS
ORAL_TABLET | ORAL | Status: AC
  Filled 2021-08-29: qty 2

## 2021-08-29 MED ORDER — ROCURONIUM BROMIDE 10 MG/ML (PF) SYRINGE
PREFILLED_SYRINGE | INTRAVENOUS | Status: DC | PRN
  Administered 2021-08-29: 50 mg via INTRAVENOUS
  Administered 2021-08-29: 10 mg via INTRAVENOUS

## 2021-08-29 MED ORDER — GABAPENTIN 100 MG PO CAPS
ORAL_CAPSULE | ORAL | Status: AC
  Filled 2021-08-29: qty 2

## 2021-08-29 MED ORDER — PROPOFOL 10 MG/ML IV BOLUS
INTRAVENOUS | Status: DC | PRN
  Administered 2021-08-29: 140 mg via INTRAVENOUS

## 2021-08-29 MED ORDER — CELECOXIB 200 MG PO CAPS
ORAL_CAPSULE | ORAL | Status: AC
  Filled 2021-08-29: qty 1

## 2021-08-29 MED ORDER — ONDANSETRON HCL 4 MG/2ML IJ SOLN
INTRAMUSCULAR | Status: DC | PRN
  Administered 2021-08-29: 4 mg via INTRAVENOUS

## 2021-08-29 MED ORDER — SUGAMMADEX SODIUM 200 MG/2ML IV SOLN
INTRAVENOUS | Status: DC | PRN
  Administered 2021-08-29: 200 mg via INTRAVENOUS

## 2021-08-29 MED ORDER — 0.9 % SODIUM CHLORIDE (POUR BTL) OPTIME
TOPICAL | Status: DC | PRN
  Administered 2021-08-29: 500 mL

## 2021-08-29 MED ORDER — SCOPOLAMINE 1 MG/3DAYS TD PT72
MEDICATED_PATCH | TRANSDERMAL | Status: AC
  Filled 2021-08-29: qty 1

## 2021-08-29 MED ORDER — DEXMEDETOMIDINE (PRECEDEX) IN NS 20 MCG/5ML (4 MCG/ML) IV SYRINGE
PREFILLED_SYRINGE | INTRAVENOUS | Status: AC
  Filled 2021-08-29: qty 5

## 2021-08-29 MED ORDER — SODIUM CHLORIDE 0.9 % IR SOLN
Status: DC | PRN
  Administered 2021-08-29: 1000 mL

## 2021-08-29 MED ORDER — DEXAMETHASONE SODIUM PHOSPHATE 10 MG/ML IJ SOLN
INTRAMUSCULAR | Status: DC | PRN
  Administered 2021-08-29: 10 mg via INTRAVENOUS

## 2021-08-29 MED ORDER — ACETAMINOPHEN 500 MG PO TABS
1000.0000 mg | ORAL_TABLET | ORAL | Status: AC
  Administered 2021-08-29: 1000 mg via ORAL

## 2021-08-29 MED ORDER — CELECOXIB 200 MG PO CAPS
200.0000 mg | ORAL_CAPSULE | ORAL | Status: AC
  Administered 2021-08-29: 200 mg via ORAL

## 2021-08-29 MED ORDER — CEFAZOLIN SODIUM-DEXTROSE 2-4 GM/100ML-% IV SOLN
2.0000 g | INTRAVENOUS | Status: AC
  Administered 2021-08-29: 2 g via INTRAVENOUS

## 2021-08-29 MED ORDER — DEXAMETHASONE SODIUM PHOSPHATE 10 MG/ML IJ SOLN
INTRAMUSCULAR | Status: AC
  Filled 2021-08-29: qty 1

## 2021-08-29 MED ORDER — BUPIVACAINE HCL 0.25 % IJ SOLN
INTRAMUSCULAR | Status: DC | PRN
  Administered 2021-08-29: 20 mL

## 2021-08-29 MED ORDER — SCOPOLAMINE 1 MG/3DAYS TD PT72
1.0000 | MEDICATED_PATCH | TRANSDERMAL | Status: DC
  Administered 2021-08-29: 1.5 mg via TRANSDERMAL

## 2021-08-29 MED ORDER — LIDOCAINE 2% (20 MG/ML) 5 ML SYRINGE
INTRAMUSCULAR | Status: AC
  Filled 2021-08-29: qty 5

## 2021-08-29 MED ORDER — GABAPENTIN 100 MG PO CAPS
200.0000 mg | ORAL_CAPSULE | ORAL | Status: AC
  Administered 2021-08-29: 200 mg via ORAL

## 2021-08-29 MED ORDER — PROPOFOL 10 MG/ML IV BOLUS
INTRAVENOUS | Status: AC
  Filled 2021-08-29: qty 20

## 2021-08-29 MED ORDER — MIDAZOLAM HCL 2 MG/2ML IJ SOLN
INTRAMUSCULAR | Status: AC
  Filled 2021-08-29: qty 2

## 2021-08-29 MED ORDER — MIDAZOLAM HCL 2 MG/2ML IJ SOLN
INTRAMUSCULAR | Status: DC | PRN
  Administered 2021-08-29: 2 mg via INTRAVENOUS

## 2021-08-29 MED ORDER — BUPIVACAINE LIPOSOME 1.3 % IJ SUSP
INTRAMUSCULAR | Status: DC | PRN
  Administered 2021-08-29 (×2): 15 mL

## 2021-08-29 MED ORDER — ARTIFICIAL TEARS OPHTHALMIC OINT
TOPICAL_OINTMENT | OPHTHALMIC | Status: AC
  Filled 2021-08-29: qty 3.5

## 2021-08-29 MED ORDER — PHENYLEPHRINE 40 MCG/ML (10ML) SYRINGE FOR IV PUSH (FOR BLOOD PRESSURE SUPPORT)
PREFILLED_SYRINGE | INTRAVENOUS | Status: DC | PRN
  Administered 2021-08-29: 40 ug via INTRAVENOUS
  Administered 2021-08-29: 80 ug via INTRAVENOUS

## 2021-08-29 MED ORDER — LIDOCAINE 2% (20 MG/ML) 5 ML SYRINGE
INTRAMUSCULAR | Status: DC | PRN
  Administered 2021-08-29: 80 mg via INTRAVENOUS

## 2021-08-29 MED ORDER — DEXAMETHASONE SODIUM PHOSPHATE 10 MG/ML IJ SOLN: 4.0000 mg | INTRAMUSCULAR | Status: DC

## 2021-08-29 MED ORDER — ONDANSETRON HCL 4 MG/2ML IJ SOLN
INTRAMUSCULAR | Status: AC
  Filled 2021-08-29: qty 2

## 2021-08-29 MED ORDER — LACTATED RINGERS IV SOLN: INTRAVENOUS | Status: DC

## 2021-08-29 MED ORDER — CEFAZOLIN SODIUM-DEXTROSE 2-4 GM/100ML-% IV SOLN
INTRAVENOUS | Status: AC
  Filled 2021-08-29: qty 100

## 2021-08-29 MED ORDER — PROMETHAZINE HCL 25 MG/ML IJ SOLN: 6.2500 mg | INTRAMUSCULAR | Status: DC | PRN

## 2021-08-29 MED ORDER — FENTANYL CITRATE (PF) 100 MCG/2ML IJ SOLN
INTRAMUSCULAR | Status: DC | PRN
  Administered 2021-08-29: 100 ug via INTRAVENOUS
  Administered 2021-08-29 (×2): 25 ug via INTRAVENOUS
  Administered 2021-08-29: 50 ug via INTRAVENOUS

## 2021-08-29 SURGICAL SUPPLY — 54 items
ADH SKN CLS APL DERMABOND .7 (GAUZE/BANDAGES/DRESSINGS) ×1
BAG LAPAROSCOPIC 12 15 PORT 16 (BASKET) IMPLANT
BAG RETRIEVAL 12/15 (BASKET) ×2
BLADE SURG 10 STRL SS (BLADE) ×1 IMPLANT
COVER BACK TABLE 60X90IN (DRAPES) ×2 IMPLANT
COVER TIP SHEARS 8 DVNC (MISCELLANEOUS) ×1 IMPLANT
COVER TIP SHEARS 8MM DA VINCI (MISCELLANEOUS) ×2
DERMABOND ADVANCED (GAUZE/BANDAGES/DRESSINGS) ×1
DERMABOND ADVANCED .7 DNX12 (GAUZE/BANDAGES/DRESSINGS) ×1 IMPLANT
DRAPE ARM DVNC X/XI (DISPOSABLE) ×4 IMPLANT
DRAPE COLUMN DVNC XI (DISPOSABLE) ×1 IMPLANT
DRAPE DA VINCI XI ARM (DISPOSABLE) ×8
DRAPE DA VINCI XI COLUMN (DISPOSABLE) ×2
DRAPE SHEET LG 3/4 BI-LAMINATE (DRAPES) ×2 IMPLANT
DRAPE SURG IRRIG POUCH 19X23 (DRAPES) ×2 IMPLANT
DRSG OPSITE POSTOP 3X4 (GAUZE/BANDAGES/DRESSINGS) ×1 IMPLANT
ELECT REM PT RETURN 9FT ADLT (ELECTROSURGICAL) ×2
ELECTRODE REM PT RTRN 9FT ADLT (ELECTROSURGICAL) ×1 IMPLANT
GAUZE 4X4 16PLY ~~LOC~~+RFID DBL (SPONGE) ×6 IMPLANT
GLOVE SURG ENC MOIS LTX SZ6 (GLOVE) ×8 IMPLANT
GLOVE SURG ENC MOIS LTX SZ6.5 (GLOVE) ×5 IMPLANT
GLOVE SURG UNDER POLY LF SZ6.5 (GLOVE) ×1 IMPLANT
GLOVE SURG UNDER POLY LF SZ7 (GLOVE) ×4 IMPLANT
HEMOSTAT SURGICEL 4X8 (HEMOSTASIS) IMPLANT
HOLDER FOLEY CATH W/STRAP (MISCELLANEOUS) ×2 IMPLANT
IRRIG SUCT STRYKERFLOW 2 WTIP (MISCELLANEOUS) ×2
IRRIGATION SUCT STRKRFLW 2 WTP (MISCELLANEOUS) ×1 IMPLANT
KIT TURNOVER CYSTO (KITS) ×2 IMPLANT
LEGGING LITHOTOMY PAIR STRL (DRAPES) ×2 IMPLANT
MANIPULATOR ADVINCU DEL 2.5 PL (MISCELLANEOUS) ×1 IMPLANT
NEEDLE HYPO 22GX1.5 SAFETY (NEEDLE) ×2 IMPLANT
OBTURATOR OPTICAL STANDARD 8MM (TROCAR) ×2
OBTURATOR OPTICAL STND 8 DVNC (TROCAR) ×1
OBTURATOR OPTICALSTD 8 DVNC (TROCAR) ×1 IMPLANT
PACK ROBOT GYN CUSTOM WL (TRAY / TRAY PROCEDURE) ×2 IMPLANT
PACK ROBOTIC GOWN (GOWN DISPOSABLE) ×2 IMPLANT
PAD POSITIONING PINK XL (MISCELLANEOUS) ×2 IMPLANT
PENCIL SMOKE EVACUATOR (MISCELLANEOUS) ×1 IMPLANT
PORT ACCESS TROCAR AIRSEAL 12 (TROCAR) ×1 IMPLANT
PORT ACCESS TROCAR AIRSEAL 5M (TROCAR) ×1
SEAL CANN UNIV 5-8 DVNC XI (MISCELLANEOUS) ×3 IMPLANT
SEAL XI 5MM-8MM UNIVERSAL (MISCELLANEOUS) ×6
SET TRI-LUMEN FLTR TB AIRSEAL (TUBING) ×2 IMPLANT
SUT MNCRL AB 4-0 PS2 18 (SUTURE) ×1 IMPLANT
SUT VIC AB 0 CT1 36 (SUTURE) ×2 IMPLANT
SUT VIC AB 4-0 PS2 18 (SUTURE) ×5 IMPLANT
SUT VLOC 180 0 9IN  GS21 (SUTURE) ×2
SUT VLOC 180 0 9IN GS21 (SUTURE) IMPLANT
TRAY FOL W/BAG SLVR 16FR STRL (SET/KITS/TRAYS/PACK) ×1 IMPLANT
TRAY FOLEY W/BAG SLVR 16FR LF (SET/KITS/TRAYS/PACK) ×2
TROCAR BLADELESS OPT 5 100 (ENDOMECHANICALS) ×1 IMPLANT
TUBE CONNECTING 12X1/4 (SUCTIONS) ×2 IMPLANT
UNDERPAD 30X36 HEAVY ABSORB (UNDERPADS AND DIAPERS) ×2 IMPLANT
YANKAUER SUCT BULB TIP NO VENT (SUCTIONS) ×1 IMPLANT

## 2021-08-29 NOTE — Transfer of Care (Signed)
Immediate Anesthesia Transfer of Care Note  Patient: LORIANE EISENHAUER  Procedure(s) Performed: Procedure(s) (LRB): XI ROBOTIC ASSISTED TOTAL HYSTERECTOMY WITH BILATERAL SALPINGO OOPHORECTOMY; MINI LAPAROTOMY (Bilateral)  Patient Location: PACU  Anesthesia Type: General  Level of Consciousness: awake, oriented, sedated and patient cooperative  Airway & Oxygen Therapy: Patient Spontanous Breathing and Patient connected to face mask oxygen  Post-op Assessment: Report given to PACU RN and Post -op Vital signs reviewed and stable  Post vital signs: Reviewed and stable  Complications: No apparent anesthesia complications Last Vitals:  Vitals Value Taken Time  BP 161/77 08/29/21 1345  Temp    Pulse 85 08/29/21 1345  Resp 21 08/29/21 1345  SpO2 95 % 08/29/21 1345  Vitals shown include unvalidated device data.  Last Pain:  Vitals:   08/29/21 0933  TempSrc: Oral  PainSc: 0-No pain      Patients Stated Pain Goal: 7 ([id number])  Complications: No notable events documented.

## 2021-08-29 NOTE — Anesthesia Preprocedure Evaluation (Signed)
Anesthesia Evaluation  Patient identified by MRN, date of birth, ID band Patient awake    Reviewed: Allergy & Precautions, NPO status , Patient's Chart, lab work & pertinent test results  Airway Mallampati: II  TM Distance: >3 FB Neck ROM: Full    Dental  (+) Teeth Intact, Dental Advisory Given, Partial Upper   Pulmonary Current Smoker and Patient abstained from smoking.,    Pulmonary exam normal breath sounds clear to auscultation       Cardiovascular negative cardio ROS Normal cardiovascular exam Rhythm:Regular Rate:Normal     Neuro/Psych negative neurological ROS     GI/Hepatic Neg liver ROS, GERD  Medicated,  Endo/Other  Hypothyroidism   Renal/GU negative Renal ROS     Musculoskeletal negative musculoskeletal ROS (+)   Abdominal   Peds  Hematology negative hematology ROS (+)   Anesthesia Other Findings Day of surgery medications reviewed with the patient.  Reproductive/Obstetrics Fibroids                              Anesthesia Physical Anesthesia Plan  ASA: 2  Anesthesia Plan: General   Post-op Pain Management:    Induction: Intravenous  PONV Risk Score and Plan: 3 and Scopolamine patch - Pre-op, Midazolam, Dexamethasone and Ondansetron  Airway Management Planned: Oral ETT  Additional Equipment:   Intra-op Plan:   Post-operative Plan: Extubation in OR  Informed Consent: I have reviewed the patients History and Physical, chart, labs and discussed the procedure including the risks, benefits and alternatives for the proposed anesthesia with the patient or authorized representative who has indicated his/her understanding and acceptance.     Dental advisory given  Plan Discussed with: CRNA  Anesthesia Plan Comments:         Anesthesia Quick Evaluation

## 2021-08-29 NOTE — Discharge Instructions (Addendum)
08/29/2021  Return to work: 4-6 weeks if applicable  You will have a white honeycomb dressing over your larger incision. This dressing can be removed 5 days after surgery and you do not need to reapply a new dressing. Once you remove the dressing, you will notice that you have the surgical glue (dermabond) on the incision and this will peel off on its own. You can get this dressing wet in the shower the days after surgery prior to removal on the 5th day.   Activity: 1. Be up and out of the bed during the day.  Take a nap if needed.  You may walk up steps but be careful and use the hand rail.  Stair climbing will tire you more than you think, you may need to stop part way and rest.   2. No lifting or straining over 10 lbs, pushing, pulling, straining for 6 weeks.  3. No driving for 1 week(s).  Do not drive if you are taking narcotic pain medicine. You need to make sure your reaction time has returned and you can brake safely.  4. Shower daily.  Use your regular soap to bathe and when finished pat your incision dry; don't rub.  No tub baths until cleared by your surgeon.   5. No sexual activity and nothing in the vagina for 8 weeks.  6. You may experience a small amount of clear drainage from your incision(s), which is normal.  If the drainage persists or increases, please call the office.  7. You may experience vaginal spotting after surgery or around the 6-8 week mark from surgery when the stitches at the top of the vagina begin to dissolve.  The spotting is normal but if you experience heavy bleeding, call our office.  8. Take Tylenol or ibuprofen first for pain and only use narcotic pain medication for severe pain not relieved by the Tylenol or Ibuprofen.  Monitor your Tylenol intake to a max of 4,000 mg.   Diet: 1. Low sodium Heart Healthy Diet is recommended.  2. It is safe to use a laxative, such as Miralax or Colace, if you have difficulty moving your bowels. You can take Sennakot at  bedtime every evening to keep bowel movements regular and to prevent constipation.    Wound Care: 1. Keep clean and dry.  Shower daily.  Reasons to call the Doctor: Fever - Oral temperature greater than 100.4 degrees Fahrenheit Foul-smelling vaginal discharge Difficulty urinating Nausea and vomiting Increased pain at the site of the incision that is unrelieved with pain medicine. Difficulty breathing with or without chest pain New calf pain especially if only on one side Sudden, continuing increased vaginal bleeding with or without clots.   Contacts: For questions or concerns you should contact:  Dr. Everitt Amber at [phone]  Joylene John, NP at [phone]  After Hours: call [phone] and have the GYN Oncologist paged/contacted  Do not take any Tylenol until after 3:30 pm today.        Post Anesthesia Home Care Instructions  Activity: Get plenty of rest for the remainder of the day. A responsible individual must stay with you for 24 hours following the procedure.  For the next 24 hours, DO NOT: -Drive a car -Paediatric nurse -Drink alcoholic beverages -Take any medication unless instructed by your physician -Make any legal decisions or sign important papers.  Meals: Start with liquid foods such as gelatin or soup. Progress to regular foods as tolerated. Avoid greasy, spicy, heavy foods. If nausea  and/or vomiting occur, drink only clear liquids until the nausea and/or vomiting subsides. Call your physician if vomiting continues.  Special Instructions/Symptoms: Your throat may feel dry or sore from the anesthesia or the breathing tube placed in your throat during surgery. If this causes discomfort, gargle with warm salt water. The discomfort should disappear within 24 hours.  If you had a scopolamine patch placed behind your ear for the management of post- operative nausea and/or vomiting:  1. The medication in the patch is effective for 72 hours, after which  it should be removed.  Wrap patch in a tissue and discard in the trash. Wash hands thoroughly with soap and water. 2. You may remove the patch earlier than 72 hours if you experience unpleasant side effects which may include dry mouth, dizziness or visual disturbances. 3. Avoid touching the patch. Wash your hands with soap and water after contact with the patch.

## 2021-08-29 NOTE — H&P (Signed)
Follow-up Note: Gyn-Onc  Consult was requested by Dr. Stann Mainland for the evaluation of Kaitlyn Jenkins 60 y.o. female  CC:  Fibroids, postmenopausal bleeding   Assessment/Plan:  Kaitlyn Jenkins  is a 60 y.o.  year old with a uterine mass/fibroid approximately 6cm with benign endometrial biopsy and postmenopausal bleeding since 2020.  Endometrium benign on sampling  Given the persistent nature of her postmenopausal bleeding and subtle fibroid growth over time, I am recommending proceeding with robotic assisted total hysterectomy and BSO. While I continue to have a low suspicion for LMS, I think this is reasonable. I explained that hysterectomy is associated with increased risk for complications for her given her history of known pelvic adhesive disease. She may require minilap for specimen delivery due to her nulliparous state and 6cm uterine fibroid. We counseled her regarding surgery, and the need for postoperative recovery, 2 weeks off of in person work, return to at home work at 2 weeks, in person work at 4 weeks.   HPI: Kaitlyn Jenkins is a 59 year old P0 who is seen in consultation at the request of Dr Stann Mainland for a uterine mass and concern for possible malignancy.  Patient began having occasional postmenopausal spotting in January 2020.  This occurred occasionally and was only spots of blood with wiping after passing urine.  She denies having to wear a pad or having heavy hemorrhage.  She was seen by her OB/GYN Dr. Stann Mainland who performed a transvaginal ultrasound scan on February 22, 2019 and this showed a uterus measuring 7.5 x 4.8 x 5 cm with an endometrium that was difficult to visualize.  There was a mass resembling a myoma measuring 4.8 cm.  The ovaries were not visualized but the adnexa appeared normal.  An endometrial biopsy was performed in the office on March 02, 2019.  This revealed rare benign endometrial fragments fragments seen in a specimen that consists mainly of bloody  mucus.  She was seen by me at that time and my recommendation was further work-up with an MRI and D&C. However, when the [employer] pandemic hit, she canceled her MRI appointment and was lost to follow-up. She had no bleeding for 2 years until early May, 2022 when she began experiencing persistent daily light bleeding. She returned to see Dr Stann Mainland on 05/07/21 who performed a endometrial biopsy that showed rare inactive endometrial glands, predominantly blood, extremely scanty tissue.  Pap smear was taken however the the specimen was unsatisfactory for evaluation due to insufficient cellularity.  Transvaginal ultrasound scan was performed on 05/15/2021 and revealed a uterus measuring 6.6 x 5.8 x 7.5 cm.  To the endometrium was difficult to visualize given the overlying fibroid however the visualized portions were measured to be thickened up to 21 mm.  The right and left ovaries were grossly normal bilaterally.  There was a 6.4 cm intramural fibroid at the central aspect of the uterine fundus (this had been previously 4.8 cm).  The fibroid showed a heterogeneous make up with some Doppler signal on the periphery of the fibroid.  Interval Hx:   On 07/12/2021 she underwent a D&C procedure.  Intraoperative findings was significant for uterus sounded to 8 cm.  Pathology revealed superficial fragments of inactive endometrium with no malignancy or dysplasia identified.  She has had continued light bleeding since this procedure.  Current Meds:  Outpatient Encounter Medications as of 07/24/2021  Medication Sig   ibuprofen (ADVIL) 800 MG tablet Take 1 tablet (800 mg total) by mouth every  8 (eight) hours as needed for moderate pain. For AFTER surgery only   senna-docusate (SENOKOT-S) 8.6-50 MG tablet Take 2 tablets by mouth at bedtime. For AFTER surgery, do not take if having diarrhea   traMADol (ULTRAM) 50 MG tablet Take 1 tablet (50 mg total) by mouth every 6 (six) hours as needed for severe pain. For AFTER surgery, do not  take and drive   esomeprazole (NEXIUM) 20 MG capsule Take 20 mg by mouth every morning.   levothyroxine (SYNTHROID, LEVOTHROID) 125 MCG tablet Take 125 mcg by mouth daily.   Multiple Vitamin (MULTIVITAMIN WITH MINERALS) TABS tablet Take 1 tablet by mouth daily.   Omega-3 Fatty Acids (FISH OIL PO) Take 1 capsule by mouth daily. Stopped taking and will start after her surgery after checking with her dr. 07/06/21   No facility-administered encounter medications on file as of 07/24/2021.    Allergy:  Allergies  Allergen Reactions   Codeine Nausea And Vomiting    Per patient 03-12-19    Social Hx:   Social History   Socioeconomic History   Marital status: Married    Spouse name: Not on file   Number of children: Not on file   Years of education: Not on file   Highest education level: Not on file  Occupational History   Not on file  Tobacco Use   Smoking status: Every Day    Packs/day: 1.00    Years: 38.00    Pack years: 38.00    Types: Cigarettes   Smokeless tobacco: Never  Vaping Use   Vaping Use: Never used  Substance and Sexual Activity   Alcohol use: Yes    Alcohol/week: 4.0 standard drinks    Types: 4 Cans of beer per week    Comment: 4/week   Drug use: No   Sexual activity: Not Currently  Other Topics Concern   Not on file  Social History Narrative   Not on file   Social Determinants of Health   Financial Resource Jenkins: Not on file  Food Insecurity: Not on file  Transportation Needs: Not on file  Physical Activity: Not on file  Stress: Not on file  Social Connections: Not on file  Intimate Partner Violence: Not on file    Past Surgical Hx:  Past Surgical History:  Procedure Laterality Date   DILATION AND CURETTAGE OF UTERUS N/A 07/12/2021   Procedure: DILATATION AND CURETTAGE;  Surgeon: Everitt Amber, MD;  Location: [facility];  Service: Gynecology;  Laterality: N/A;   EXTRACORPOREAL SHOCK WAVE LITHOTRIPSY  2016   fallopian tubes remove  due to scarring  [place] [facility]    right total hip replaced in 2010.   right elbow tendon repair  07/17/2021   @ [facility]    Past Medical Hx:  Past Medical History:  Diagnosis Date   Complication of anesthesia 07/17/2021   O2 stas  dropped to 88 on O2 and ra, improved over time note on chart from [facility] [place]   Elbow injury 07/06/2021   fell on july 3,2022 fell on her elbow , saw orthopedic dr on July 06, 2021, bone chipped off will need surgery   GERD (gastroesophageal reflux disease)    History of kidney stones    Hypothyroidism    PMB (postmenopausal bleeding)    Uterine fibroid    Wears glasses 08/23/2021   Wears partial dentures 08/23/2021   upper    Past Gynecological History:  G0 No  LMP recorded. Patient is postmenopausal.  Family Hx: History reviewed. No pertinent family history.  Review of Systems:  Constitutional  Feels well,    ENT Normal appearing ears and nares bilaterally Skin/Breast  No rash, sores, jaundice, itching, dryness Cardiovascular  No chest pain, shortness of breath, or edema  Pulmonary  No cough or wheeze.  Gastro Intestinal  No nausea, vomitting, or diarrhoea. No bright red blood per rectum, no abdominal pain, change in bowel movement, or constipation.  Genito Urinary  No frequency, urgency, dysuria, + postmenopausal bleeding.  Musculo Skeletal  No myalgia, arthralgia, joint swelling or pain  Neurologic  No weakness, numbness, change in gait,  Psychology  No depression, anxiety, insomnia.   Vitals:  Blood pressure (!) 156/84, pulse 79, temperature 97.9 F (36.6 C), temperature source Oral, resp. rate 15, height '5\' 3"'$  (1.6 m), weight 138 lb 12.8 oz (63 kg), SpO2 98 %.  Physical Exam: WD in NAD Neck  Supple NROM, without any enlargements.  Lymph Node Survey No cervical supraclavicular or inguinal adenopathy Cardiovascular  Pulse normal rate, regularity and rhythm. S1 and S2 normal.   Lungs  Clear to auscultation bilateraly, without wheezes/crackles/rhonchi. Good air movement.  Skin  No rash/lesions/breakdown  Psychiatry  Alert and oriented to person, place, and time  Abdomen  Normoactive bowel sounds, abdomen soft, non-tender and nonobese without evidence of hernia.  Back No CVA tenderness Genito Urinary  Vulva/vagina: Normal external female genitalia.  No lesions. No discharge or bleeding.  Bladder/urethra:  No lesions or masses, well supported bladder  Vagina: normal  Cervix: Normal appearing, no lesions.  Uterus:  Small, mobile, no parametrial involvement or nodularity.  Adnexa: no discrete masses. Rectal  deferred.  Extremities  No bilateral cyanosis, clubbing or edema.   Thereasa Solo, MD  08/29/2021, 11:06 AM

## 2021-08-29 NOTE — Op Note (Signed)
OPERATIVE NOTE 08/29/21  Surgeon: Donaciano Eva   Assistants: Joylene John, NP (a provider assistant was necessary for tissue manipulation, management of robotic instrumentation, retraction and positioning due to the complexity of the case and hospital policies).   Anesthesia: General endotracheal anesthesia  ASA Class: 3   Pre-operative Diagnosis: fibroids  Post-operative Diagnosis:  same  Operation: Robotic-assisted laparoscopic total hysterectomy with bilateral salpingoophorectomy, minilaparotomy   Surgeon: Donaciano Eva  Assistant Surgeon: Joylene John, NP  Anesthesia: GET  Urine Output: 100cc  Operative Findings:  : 12cm uterus with bulky posterior fundal fibroid, surgically interrupted tubes, normal ovaries, adhesiosn between omentum and posterior lower uterine segment.   Estimated Blood Loss:   30cc       Total IV Fluids: 600 ml         Specimens:  uterus with cervix and bilateral tubes and ovaries, omentum         Complications:  None; patient tolerated the procedure well.         Disposition: PACU - hemodynamically stable.  Procedure Details  The patient was seen in the Holding Room. The risks, benefits, complications, treatment options, and expected outcomes were discussed with the patient.  The patient concurred with the proposed plan, giving informed consent.  The site of surgery properly noted/marked. The patient was identified as Kaitlyn Jenkins and the procedure verified as a Robotic-assisted hysterectomy with bilateral salpingo oophorectomy. A Time Out was held and the above information confirmed.  After induction of anesthesia, the patient was draped and prepped in the usual sterile manner. Pt was placed in supine position after anesthesia and draped and prepped in the usual sterile manner. The abdominal drape was placed after the CholoraPrep had been allowed to dry for 3 minutes.  Her arms were tucked to her side with all appropriate  precautions.  The shoulders were stabilized with padded shoulder blocks applied to the acromium processes.  The patient was placed in the semi-lithotomy position in [place].  The perineum was prepped with Betadine. The patient was then prepped. Foley catheter was placed.  A sterile speculum was placed in the vagina.  The cervix was grasped with a single-tooth tenaculum and dilated with the pratts dilators before placing the advincula delineator with small colpotomizer ring was placed without difficulty.  A pneum occluder balloon was placed over the manipulator.  OG tube placement was confirmed and to suction.   Next, a 5 mm skin incision was made 1 cm below the subcostal margin in the midclavicular line.  The 5 mm Optiview port and scope was used for direct entry.  Opening pressure was under 10 mm CO2.  The abdomen was insufflated and the findings were noted as above.   At this point and all points during the procedure, the patient's intra-abdominal pressure did not exceed 15 mmHg. Next, a 10 mm skin incision was made in  the umbilicus and a right and left port was placed about 10 cm lateral to the robot port on the right and left side.  All ports were placed under direct visualization.  The patient was placed in steep Trendelenburg.  Bowel was folded away into the upper abdomen.  The robot was docked in the normal manner.  The omental adhesions to the uterus were taken down and a specimen of omentum was retrieved as it was devitalized and sent for permanent pathology. The hysterectomy was started after the round ligament on the right side was incised and the retroperitoneum was entered and  the pararectal space was developed.  The ureter was noted to be on the medial leaf of the broad ligament.  The peritoneum above the ureter was incised and stretched and the infundibulopelvic ligament was skeletonized, cauterized and cut.  The posterior peritoneum was taken down to the level of the KOH ring.  The  anterior peritoneum was also taken down.  The bladder flap was created to the level of the KOH ring.  The uterine artery on the right side was skeletonized, cauterized and cut in the normal manner.  A similar procedure was performed on the left.  The colpotomy was made and the uterus, cervix, bilateral ovaries and tubes but could not be delivered through the vagina due to uterine size.  It was placed in an endocatch bag. Pedicles were inspected and excellent hemostasis was achieved.    A 6cm suprapubic low transverse incision was made with the scalpel. The subcutaneous skin was opened with the bovie. The fascia was opened with the bovie transversely and the rectus muscles were dissected off of the fascia inferiorally and superiorally. The peritoneum was opened sharply in the midline. The peritoneal incision was extended. The uterine specimen in the endocatch bag was retrieved through the incision. The fascia was closed with running 0-vicryl. The subcutaneous fat was closed with 2-0 vicryl. 20cc of exparel mixed with 20cc of marcaine was infiltrated into the incision. The incision was closed at the skin with monocryl and dermabond.    The colpotomy at the vaginal cuff was closed with Vicryl on a CT1 needle in a running manner.  Irrigation was used and excellent hemostasis was achieved.  At this point in the procedure was completed.  Robotic instruments were removed under direct visulaization.  The robot was undocked. The 10 mm ports were closed with Vicryl on a UR-5 needle and the fascia was closed with 0 Vicryl on a UR-5 needle.  The skin was closed with 4-0 Vicryl in a subcuticular manner.  Dermabond was applied.  Sponge, lap and needle counts correct x 2.  The patient was taken to the recovery room in stable condition.  The vagina was swabbed with  minimal bleeding noted.   All instrument and needle counts were correct x  3.   The patient was transferred to the recovery room in a stable  condition.  Donaciano Eva, MD

## 2021-08-29 NOTE — Anesthesia Procedure Notes (Signed)
Procedure Name: Intubation Date/Time: 08/29/2021 11:51 AM Performed by: Rogers Blocker, CRNA Pre-anesthesia Checklist: Patient identified, Emergency Drugs available, Suction available and Patient being monitored Patient Re-evaluated:Patient Re-evaluated prior to induction Oxygen Delivery Method: Circle system utilized Preoxygenation: Pre-oxygenation with 100% oxygen Induction Type: IV induction Ventilation: Mask ventilation without difficulty Laryngoscope Size: Mac and 3 Grade View: Grade I Tube type: Oral Number of attempts: 1 Airway Equipment and Method: Stylet Placement Confirmation: ETT inserted through vocal cords under direct vision, positive ETCO2 and breath sounds checked- equal and bilateral Secured at: 22 cm Tube secured with: Tape Dental Injury: Teeth and Oropharynx as per pre-operative assessment

## 2021-08-29 NOTE — Anesthesia Postprocedure Evaluation (Signed)
Anesthesia Post Note  Patient: Kaitlyn Jenkins  Procedure(s) Performed: XI ROBOTIC ASSISTED TOTAL HYSTERECTOMY WITH BILATERAL SALPINGO OOPHORECTOMY; MINI LAPAROTOMY (Bilateral: Abdomen)     Patient location during evaluation: PACU Anesthesia Type: General Level of consciousness: awake and alert Pain management: pain level controlled Vital Signs Assessment: post-procedure vital signs reviewed and stable Respiratory status: spontaneous breathing, nonlabored ventilation and respiratory function stable Cardiovascular status: stable and blood pressure returned to baseline Anesthetic complications: no   No notable events documented.  Last Vitals:  Vitals:   08/29/21 1446 08/29/21 1449  BP: (!) 142/67   Pulse: 83 75  Resp: 15 10  Temp:    SpO2: 92% 96%    Last Pain:  Vitals:   08/29/21 1445  TempSrc:   PainSc: [name]

## 2021-08-30 ENCOUNTER — Telehealth: Payer: Self-pay

## 2021-08-30 ENCOUNTER — Encounter (HOSPITAL_BASED_OUTPATIENT_CLINIC_OR_DEPARTMENT_OTHER): Payer: Self-pay | Admitting: Gynecologic Oncology

## 2021-08-30 LAB — URINE CULTURE: Culture: >=100000 — AB

## 2021-08-30 NOTE — Telephone Encounter (Signed)
Spoke with Kaitlyn Jenkins this morning. She states she is eating, drinking and urinating well. She has not had a BM yet but is passing gas. She is taking senokot as prescribed and encouraged her to drink plenty of water. She denies fever or chills. Incisions are dry and intact. Instructed to remove honeycomb dressing five days after surgery. Patient denies pain but states she's just sore.  Instructed to call office with any fever, chills, purulent drainage, uncontrolled pain or any other questions or concerns. Patient verbalizes understanding.   Pt aware of post op appointments as well as the office number [phone] and after hours number [phone] to call if she has any questions or concerns

## 2021-08-31 ENCOUNTER — Telehealth: Payer: Self-pay

## 2021-08-31 ENCOUNTER — Telehealth: Payer: Self-pay | Admitting: *Deleted

## 2021-08-31 DIAGNOSIS — N390 Urinary tract infection, site not specified: Secondary | ICD-10-CM

## 2021-08-31 LAB — SURGICAL PATHOLOGY

## 2021-08-31 MED ORDER — NITROFURANTOIN MONOHYD MACRO 100 MG PO CAPS
100.0000 mg | ORAL_CAPSULE | Freq: Two times a day (BID) | ORAL | 0 refills | Status: DC
Start: 2021-08-31 — End: 2022-01-18

## 2021-08-31 NOTE — Telephone Encounter (Signed)
Told Ms Ahlert that the urine culture from 08-28-21 showed that she has a UTI.  Melissa Cross,NP will send in Macrobid 100 mg tabs to take 1 tablet bid for 5 days.  Prescription sent in to [facility].  Pt verbalized understanding.

## 2021-08-31 NOTE — Telephone Encounter (Signed)
Fax patient's FMLA paperwork

## 2021-09-03 ENCOUNTER — Telehealth: Payer: Self-pay

## 2021-09-03 NOTE — Telephone Encounter (Signed)
Told [name] that the pathology was benign. No pre-cancer or cancer per Kaitlyn Cross,NP.   Pt stated that she was feeling well.  She wanted to return to work on 09-10-21 full time as she works as an Web designer. She can work full time from 09-10-21 through 10-19 with the following restrictions: No lifting or straining  over 10 pounds. No pushing, pulling, straining.  E-mailed letter to [email or web address]. Pt can print it from my chart. She has not received the e-mail yet.

## 2021-09-07 ENCOUNTER — Other Ambulatory Visit (HOSPITAL_COMMUNITY): Payer: No Typology Code available for payment source

## 2021-09-11 DIAGNOSIS — N950 Postmenopausal bleeding: Secondary | ICD-10-CM

## 2021-09-11 DIAGNOSIS — D251 Intramural leiomyoma of uterus: Secondary | ICD-10-CM

## 2021-09-14 ENCOUNTER — Encounter: Payer: Self-pay | Admitting: Gynecologic Oncology

## 2021-09-17 ENCOUNTER — Other Ambulatory Visit: Payer: Self-pay

## 2021-09-17 ENCOUNTER — Inpatient Hospital Stay: Payer: No Typology Code available for payment source | Attending: Gynecologic Oncology | Admitting: Gynecologic Oncology

## 2021-09-17 ENCOUNTER — Encounter: Payer: Self-pay | Admitting: Gynecologic Oncology

## 2021-09-17 VITALS — BP 159/69 | HR 77 | Temp 98.4°F | Resp 16 | Ht 63.0 in | Wt 144.2 lb

## 2021-09-17 DIAGNOSIS — Z90722 Acquired absence of ovaries, bilateral: Secondary | ICD-10-CM | POA: Insufficient documentation

## 2021-09-17 DIAGNOSIS — Z90710 Acquired absence of both cervix and uterus: Secondary | ICD-10-CM | POA: Diagnosis not present

## 2021-09-17 DIAGNOSIS — D251 Intramural leiomyoma of uterus: Secondary | ICD-10-CM | POA: Insufficient documentation

## 2021-09-17 DIAGNOSIS — B373 Candidiasis of vulva and vagina: Secondary | ICD-10-CM | POA: Insufficient documentation

## 2021-09-17 DIAGNOSIS — B3731 Acute candidiasis of vulva and vagina: Secondary | ICD-10-CM

## 2021-09-17 MED ORDER — MONISTAT 1 COMBO PACK 1200 & 2 MG & % VA KIT: 1.0000 | PACK | Freq: Once | VAGINAL | 0 refills | Status: AC

## 2021-09-17 MED ORDER — FLUCONAZOLE 100 MG PO TABS: 150.0000 mg | ORAL_TABLET | Freq: Once | ORAL | 0 refills | Status: AC

## 2021-09-17 NOTE — Patient Instructions (Signed)
There is evidence of yeast infection on the skin of the vulva.  Please apply monistat cream to the red skin folds in between the legs until the itch is no longer felt.  Take the diflucan tablets once (single dose) by mouth.   Please follow-up with Dr Stann Mainland for ongoing routine gynecologic care.

## 2021-09-17 NOTE — Progress Notes (Signed)
Follow-up Note: Gyn-Onc  Consult was requested by Dr. Stann Mainland for the evaluation of Kaitlyn Jenkins 60 y.o. female  CC:  Chief Complaint  Patient presents with   Fibroids, intramural     Assessment/Plan:  [name]  is a 60 y.o.  year old with a uterine mass/fibroid s/p robotic assisted total hysterectomy with BSO on 08/29/21. Pathology all benign.  She will follow-up with Dr Stann Mainland for routine gynecologic follow-up.   Vulvar candidiasis- prescribed diflucan and myconazole.   HPI: [name] is a 60 year old P0 who is seen in consultation at the request of Dr Stann Mainland for a uterine mass and concern for possible malignancy.  Patient began having occasional postmenopausal spotting in January 2020.  This occurred occasionally and was only spots of blood with wiping after passing urine.  She denies having to wear a pad or having heavy hemorrhage.  She was seen by her OB/GYN Dr. Stann Mainland who performed a transvaginal ultrasound scan on February 22, 2019 and this showed a uterus measuring 7.5 x 4.8 x 5 cm with an endometrium that was difficult to visualize.  There was a mass resembling a myoma measuring 4.8 cm.  The ovaries were not visualized but the adnexa appeared normal.  An endometrial biopsy was performed in the office on March 02, 2019.  This revealed rare benign endometrial fragments fragments seen in a specimen that consists mainly of bloody mucus.  Interval Hx:  She was seen by me at that time and my recommendation was further work-up with an MRI and D&C. However, when the [place] pandemic hit, she canceled her MRI appointment and was lost to follow-up. She had no bleeding for 2 years until early May, 2022 when she began experiencing persistent daily light bleeding. She returned to see Dr Stann Mainland on 05/07/21 who performed a endometrial biopsy that showed rare inactive endometrial glands, predominantly blood, extremely scanty tissue.  Pap smear was taken however the the specimen was  unsatisfactory for evaluation due to insufficient cellularity.  Transvaginal ultrasound scan was performed on 05/15/2021 and revealed a uterus measuring 6.6 x 5.8 x 7.5 cm.  To the endometrium was difficult to visualize given the overlying fibroid however the visualized portions were measured to be thickened up to 21 mm.  The right and left ovaries were grossly normal bilaterally.  There was a 6.4 cm intramural fibroid at the central aspect of the uterine fundus (this had been previously 4.8 cm).  The fibroid showed a heterogeneous make up with some Doppler signal on the periphery of the fibroid.  On 07/12/2021 she underwent a D&C procedure.  Intraoperative findings was significant for uterus sounded to 8 cm.  Pathology revealed superficial fragments of inactive endometrium with no malignancy or dysplasia identified.    Interval Hx:   On 08/29/21 she underwent robotic assisted total hysterectomy with BSO. Intraoperative findings were significant for a 12cm bulky uterus with posterior fundal fibroid. The ovaries were grossly normal.Surgery was uncomplicated.  Final pathology revealed a benign endometrium and fibroids and endosalpingiosis, no malignancy or dysplasia.  Since surgery she has had vulvar irritation and itch (she received antibiotics for a UTI).  Current Meds:  Outpatient Encounter Medications as of 09/17/2021  Medication Sig   esomeprazole (NEXIUM) 20 MG capsule Take 20 mg by mouth every morning.   fluconazole (DIFLUCAN) 100 MG tablet Take 1.5 tablets (150 mg total) by mouth once for 1 dose.   ibuprofen (ADVIL) 800 MG tablet Take 1 tablet (800 mg  total) by mouth every 8 (eight) hours as needed for moderate pain. For AFTER surgery only   levothyroxine (SYNTHROID) 125 MCG tablet Euthyrox 125 mcg tablet   miconazole (MONISTAT 1 COMBO PACK) kit Place 1 each vaginally once for 1 dose.   Multiple Vitamin (MULTIVITAMIN WITH MINERALS) TABS tablet Take 1 tablet by mouth daily.   Omega-3 Fatty  Acids (FISH OIL PO) Take 1 capsule by mouth daily. Stopped taking and will start after her surgery after checking with her dr. 07/06/21   nitrofurantoin, macrocrystal-monohydrate, (MACROBID) 100 MG capsule Take 1 capsule (100 mg total) by mouth 2 (two) times daily. (Patient not taking: Reported on 09/14/2021)   senna-docusate (SENOKOT-S) 8.6-50 MG tablet Take 2 tablets by mouth at bedtime. For AFTER surgery, do not take if having diarrhea (Patient not taking: Reported on 09/14/2021)   traMADol (ULTRAM) 50 MG tablet Take 1 tablet (50 mg total) by mouth every 6 (six) hours as needed for severe pain. For AFTER surgery, do not take and drive (Patient not taking: Reported on 09/14/2021)   [DISCONTINUED] levothyroxine (SYNTHROID, LEVOTHROID) 125 MCG tablet Take 125 mcg by mouth daily.   No facility-administered encounter medications on file as of 09/17/2021.    Allergy:  Allergies  Allergen Reactions   Codeine Nausea And Vomiting    Per patient 03-12-19    Social Hx:   Social History   Socioeconomic History   Marital status: Married    Spouse name: Not on file   Number of children: Not on file   Years of education: Not on file   Highest education level: Not on file  Occupational History   Not on file  Tobacco Use   Smoking status: Every Day    Packs/day: 1.00    Years: 38.00    Pack years: 38.00    Types: Cigarettes   Smokeless tobacco: Never  Vaping Use   Vaping Use: Never used  Substance and Sexual Activity   Alcohol use: Yes    Alcohol/week: 4.0 standard drinks    Types: 4 Cans of beer per week    Comment: 4/week   Drug use: No   Sexual activity: Not Currently  Other Topics Concern   Not on file  Social History Narrative   Not on file   Social Determinants of Health   Financial Resource Strain: Not on file  Food Insecurity: Not on file  Transportation Needs: Not on file  Physical Activity: Not on file  Stress: Not on file  Social Connections: Not on file  Intimate  Partner Violence: Not on file    Past Surgical Hx:  Past Surgical History:  Procedure Laterality Date   DILATION AND CURETTAGE OF UTERUS N/A 07/12/2021   Procedure: DILATATION AND CURETTAGE;  Surgeon: Everitt Amber, MD;  Location: [facility];  Service: Gynecology;  Laterality: N/A;   EXTRACORPOREAL SHOCK WAVE LITHOTRIPSY  2016   fallopian tubes remove due to scarring  [place] Right    right total hip replaced in 2010.   right elbow tendon repair  07/17/2021   @ [facility]   ROBOTIC ASSISTED TOTAL HYSTERECTOMY WITH BILATERAL SALPINGO OOPHERECTOMY Bilateral 08/29/2021   Procedure: XI ROBOTIC ASSISTED TOTAL HYSTERECTOMY WITH BILATERAL SALPINGO OOPHORECTOMY; MINI LAPAROTOMY;  Surgeon: Everitt Amber, MD;  Location: [facility];  Service: Gynecology;  Laterality: Bilateral;    Past Medical Hx:  Past Medical History:  Diagnosis Date   Complication of anesthesia 07/17/2021   O2 stas  dropped to 88 on O2 and ra, improved over time note on chart from surgical center of [place]   Elbow injury 07/06/2021   fell on july 3,2022 fell on her elbow , saw orthopedic dr on July 06, 2021, bone chipped off will need surgery   GERD (gastroesophageal reflux disease)    History of kidney stones    Hypothyroidism    PMB (postmenopausal bleeding)    Uterine fibroid    Wears glasses 08/23/2021   Wears partial dentures 08/23/2021   upper    Past Gynecological History:  G0 No LMP recorded. Patient is postmenopausal.  Family Hx: History reviewed. No pertinent family history.  Review of Systems:  Constitutional  Feels well,    ENT Normal appearing ears and nares bilaterally Skin/Breast  No rash, sores, jaundice, itching, dryness Cardiovascular  No chest pain, shortness of breath, or edema  Pulmonary  No cough or wheeze.  Gastro Intestinal  No nausea, vomitting, or diarrhoea. No bright red blood per rectum, no abdominal pain, change in bowel  movement, or constipation.  Genito Urinary  No frequency, urgency, dysuria, + postmenopausal bleeding.  Musculo Skeletal  No myalgia, arthralgia, joint swelling or pain  Neurologic  No weakness, numbness, change in gait,  Psychology  No depression, anxiety, insomnia.   Vitals:  Blood pressure (!) 159/69, pulse 77, temperature 98.4 F (36.9 C), temperature source Oral, resp. rate 16, height _0  (1.6 m), weight 144 lb 3.2 oz (65.4 kg), SpO2 99 %.  Physical Exam: WD in NAD Neck  Supple NROM, without any enlargements.  Lymph Node Survey No cervical supraclavicular or inguinal adenopathy Cardiovascular  Pulse normal rate, regularity and rhythm. S1 and S2 normal.  Lungs  Clear to auscultation bilateraly, without wheezes/crackles/rhonchi. Good air movement.  Skin  No rash/lesions/breakdown  Psychiatry  Alert and oriented to person, place, and time  Abdomen  Normoactive bowel sounds, abdomen soft, non-tender and nonobese without evidence of hernia. Incisions well healed. Back No CVA tenderness Genito Urinary  Vulva/vagina: extensive erythema to the vulvar skin folds with satellite lesions consistent with candidiasis.  Rectal  deferred.  Extremities  No bilateral cyanosis, clubbing or edema.   Thereasa Solo, MD  09/17/2021, 11:33 AM

## 2021-09-19 ENCOUNTER — Encounter: Payer: No Typology Code available for payment source | Admitting: Gynecologic Oncology

## 2021-11-08 ENCOUNTER — Telehealth: Payer: Self-pay

## 2021-11-08 NOTE — Telephone Encounter (Signed)
Returning call to patient regarding her message re: surgical bill. Patient states she received a bill for $30,000 for her hysterectomy on 08/29/21 with Dr. Denman George. She reports her insurance is not covering it because the surgery was not pre-certified. Instructed patient that our office is looking into this and will update her as soon as we have additional information. Patient is appreciative of assistance.

## 2021-11-09 ENCOUNTER — Telehealth: Payer: Self-pay

## 2021-11-09 NOTE — Telephone Encounter (Signed)
Left message for patient. The billing department re-filed her claim on 10/30/21 and it is still being processed by the payor. Instructed to call with any questions.

## 2021-12-01 ENCOUNTER — Ambulatory Visit
Admission: EM | Admit: 2021-12-01 | Discharge: 2021-12-01 | Disposition: A | Discharge status: Home / Self Care | Payer: No Typology Code available for payment source | Attending: Physician Assistant | Admitting: Physician Assistant

## 2021-12-01 ENCOUNTER — Other Ambulatory Visit: Payer: Self-pay

## 2021-12-01 ENCOUNTER — Encounter: Payer: Self-pay | Admitting: *Deleted

## 2021-12-01 ENCOUNTER — Ambulatory Visit (INDEPENDENT_AMBULATORY_CARE_PROVIDER_SITE_OTHER): Payer: No Typology Code available for payment source

## 2021-12-01 DIAGNOSIS — S62646A Nondisplaced fracture of proximal phalanx of right little finger, initial encounter for closed fracture: Secondary | ICD-10-CM

## 2021-12-01 DIAGNOSIS — W19XXXA Unspecified fall, initial encounter: Secondary | ICD-10-CM | POA: Diagnosis not present

## 2021-12-01 NOTE — ED Provider Notes (Signed)
[facility]    CSN: [id number] Arrival date & time: 12/01/21  8250      History   Chief Complaint Chief Complaint  Patient presents with   Fall    HPI SAANVI HAKALA is a 60 y.o. female.   Here today for evaluation of head injury and right fifth finger pain since fall 2 days ago.  She reports that she was in the shower and fell and hit the right side of her forehead on the bar in her shower.  She reports minimal pain and tenderness to this area and has developed a black eye as a result.  She denies any loss of consciousness.  She has not had any nausea or vomiting. She has not had any vision changes.  She states the worst symptom is the pain in her fifth finger.  Movement makes pain worse.  The history is provided by the patient and the spouse.  Fall Associated symptoms include headaches (none today). Pertinent negatives include no abdominal pain and no shortness of breath.   Past Medical History:  Diagnosis Date   Complication of anesthesia 07/17/2021   O2 stas  dropped to 88 on O2 and ra, improved over time note on chart from surgical center of [place]   Elbow injury 07/06/2021   fell on july 3,2022 fell on her elbow , saw orthopedic dr on July 06, 2021, bone chipped off will need surgery   GERD (gastroesophageal reflux disease)    History of kidney stones    Hypothyroidism    PMB (postmenopausal bleeding)    Uterine fibroid    Wears glasses 08/23/2021   Wears partial dentures 08/23/2021   upper    Patient Active Problem List   Diagnosis Date Noted   Fibroids, intramural 05/31/2021   Postmenopausal bleeding 05/31/2021    Past Surgical History:  Procedure Laterality Date   ABDOMINAL HYSTERECTOMY     DILATION AND CURETTAGE OF UTERUS N/A 07/12/2021   Procedure: DILATATION AND CURETTAGE;  Surgeon: Everitt Amber, MD;  Location: [facility];  Service: Gynecology;  Laterality: N/A;   EXTRACORPOREAL SHOCK WAVE LITHOTRIPSY  2016    fallopian tubes remove due to scarring  [place] Right    right total hip replaced in 2010.   right elbow tendon repair  07/17/2021   @ [facility]   ROBOTIC ASSISTED TOTAL HYSTERECTOMY WITH BILATERAL SALPINGO OOPHERECTOMY Bilateral 08/29/2021   Procedure: XI ROBOTIC ASSISTED TOTAL HYSTERECTOMY WITH BILATERAL SALPINGO OOPHORECTOMY; MINI LAPAROTOMY;  Surgeon: Everitt Amber, MD;  Location: [facility];  Service: Gynecology;  Laterality: Bilateral;    OB History   No obstetric history on file.      Home Medications    Prior to Admission medications   Medication Sig Start Date End Date Taking? Authorizing Provider  esomeprazole (NEXIUM) 20 MG capsule Take 20 mg by mouth every morning.   Yes [provider]  ibuprofen (ADVIL) 800 MG tablet Take 1 tablet (800 mg total) by mouth every 8 (eight) hours as needed for moderate pain. For AFTER surgery only 07/24/21  Yes Cross, Melissa D, NP  levothyroxine (SYNTHROID) 125 MCG tablet Euthyrox 125 mcg tablet   Yes [provider]  Multiple Vitamin (MULTIVITAMIN WITH MINERALS) TABS tablet Take 1 tablet by mouth daily.   Yes [provider]  Omega-3 Fatty Acids (FISH OIL PO) Take 1 capsule by mouth daily. Stopped taking and will start after her surgery after checking with her  dr. 07/06/21   Yes [provider]  nitrofurantoin, macrocrystal-monohydrate, (MACROBID) 100 MG capsule Take 1 capsule (100 mg total) by mouth 2 (two) times daily. Patient not taking: Reported on 09/14/2021 08/31/21   Joylene John D, NP  senna-docusate (SENOKOT-S) 8.6-50 MG tablet Take 2 tablets by mouth at bedtime. For AFTER surgery, do not take if having diarrhea Patient not taking: Reported on 09/14/2021 07/24/21   Joylene John D, NP  traMADol (ULTRAM) 50 MG tablet Take 1 tablet (50 mg total) by mouth every 6 (six) hours as needed for severe pain. For AFTER surgery, do not take and drive Patient not taking:  Reported on 09/14/2021 07/24/21   Dorothyann Gibbs, NP    Family History History reviewed. No pertinent family history.  Social History Social History   Tobacco Use   Smoking status: Every Day    Packs/day: 1.00    Years: 38.00    Pack years: 38.00    Types: Cigarettes   Smokeless tobacco: Never  Vaping Use   Vaping Use: Never used  Substance Use Topics   Alcohol use: Yes    Alcohol/week: 4.0 standard drinks    Types: 4 Cans of beer per week    Comment: 4/week   Drug use: No     Allergies   Codeine   Review of Systems Review of Systems  Constitutional:  Negative for chills and fever.  Eyes:  Negative for discharge and redness.  Respiratory:  Negative for shortness of breath.   Gastrointestinal:  Negative for abdominal pain, nausea and vomiting.  Genitourinary:  Positive for vaginal bleeding and vaginal discharge.  Musculoskeletal:  Positive for arthralgias and joint swelling.  Neurological:  Positive for headaches (none today). Negative for numbness.    Physical Exam Triage Vital Signs ED Triage Vitals  Enc Vitals Group     BP 12/01/21 1013 (!) 160/88     Pulse Rate 12/01/21 1013 87     Resp 12/01/21 1013 18     Temp 12/01/21 1013 98 F (36.7 C)     Temp Source 12/01/21 1013 Temporal     SpO2 12/01/21 1013 97 %     Weight --      Height --      Head Circumference --      Peak Flow --      Pain Score 12/01/21 1014 1     Pain Loc --      Pain Edu? --      Excl. in [place]? --    No data found.  Updated Vital Signs BP (!) 160/88   Pulse 87   Temp 98 F (36.7 C) (Temporal)   Resp 18   SpO2 97%   Physical Exam Vitals and nursing note reviewed.  Constitutional:      General: She is not in acute distress.    Appearance: Normal appearance. She is not ill-appearing.  HENT:     Head: Normocephalic and atraumatic.  Eyes:     Conjunctiva/sclera: Conjunctivae normal.  Cardiovascular:     Rate and Rhythm: Normal rate.  Pulmonary:     Effort: Pulmonary  effort is normal.  Musculoskeletal:     Comments: Swelling noted to right 5th finger diffusely- decreased ROM of same  Skin:    Capillary Refill: Brisk cap refill to distal right 5th finger    Comments: Ecchymosis noted to right eye area, area appears to be changing in color- some yellow, some deeper purple areas consistent with healing  Neurological:  Mental Status: She is alert.  Psychiatric:        Mood and Affect: Mood normal.        Behavior: Behavior normal.        Thought Content: Thought content normal.     UC Treatments / Results  Labs (all labs ordered are listed, but only abnormal results are displayed) Labs Reviewed - No data to display  EKG   Radiology DG Hand Complete Right  Result Date: 12/01/2021 CLINICAL DATA:  60 year old female status post fall on December 8th. Continued pain swelling and bruising. EXAM: RIGHT HAND - COMPLETE 3+ VIEW COMPARISON:  Right wrist series 02/28/2014. FINDINGS: Bone mineralization is within normal limits for age. There is an oblique fracture through the 5th proximal phalanx extending from the distal 3rd through the ulnar head of the phalanx. There does appear to be intra-articular involvement, but the 5th PIP remains normally aligned. Other visible osseous structures appear intact and normally aligned. Normal joint spaces for age. IMPRESSION: Oblique and intra-articular fracture of the 5th proximal phalanx. The 5th PIP remains normally aligned. Electronically Signed   By: Genevie Ann M.D.   On: 12/01/2021 10:43    Procedures Procedures (including critical care time)  Medications Ordered in UC Medications - No data to display  Initial Impression / Assessment and Plan / UC Course  I have reviewed the triage vital signs and the nursing notes.  Pertinent labs & imaging results that were available during my care of the patient were reviewed by me and considered in my medical decision making (see chart for details).    Xray with fracture  noted to right 5th finger. Splint applied in office in recommended follow up with ortho in one week. Discussed signs and symptoms warranting ED eval given head trauma, but low suspicion for intracranial injury given timing of injury and lack of worsening symptoms at this time.   Final Clinical Impressions(s) / UC Diagnoses   Final diagnoses:  Fall, initial encounter  Nondisplaced fracture of proximal phalanx of right little finger, initial encounter for closed fracture   Discharge Instructions   None    ED Prescriptions   None    PDMP not reviewed this encounter.   Francene Finders, PA-C 12/01/21 1149

## 2021-12-01 NOTE — ED Triage Notes (Signed)
Reports slipping in shower 11/29/21, hitting head on grab bar - denies loss of consciousness.  C/O HAs since incident (no HA at present), but denies n/v, vision changes.  Ecchymosis and swelling noted to right eye, but pt states he hit head just above right brow area - denies any eye c/o's.   States worst pain is in right little finger.  Swelling noted with CMS intact.

## 2021-12-26 ENCOUNTER — Telehealth: Payer: Self-pay

## 2021-12-26 NOTE — Telephone Encounter (Signed)
Received call from Kaitlyn Jenkins this afternoon. Patient reports that her insurance company is requesting a pre-certification for her surgery on 08/29/21. The re-filed claim is still in process. If a pre-certification is not received then she will get a $500 penalty. Advised patient that our office will look into this and be in touch with her. Patient appreciative of assistance.

## 2022-01-02 NOTE — Telephone Encounter (Signed)
Received a phone call from Kaitlyn Jenkins this afternoon. Patient states that she has not received any calls from anyone at [facility] regarding her billing issue. She states her $30,000 surgical bill is still in limbo. Empathized with patient and assured her I will follow up on this and call her with any updates.

## 2022-01-02 NOTE — Telephone Encounter (Signed)
Following up with Kaitlyn Jenkins, advised her that Kaitlyn Jenkins the assistant director of the revenue cycle has looked at her account. Per Kaitlyn Jenkins the patients account is still pending insurance payment after the retro authorization. Someone from the billing department will be following up with patient. Provided patient with customer service team number of [phone]. Patient states she has this number and is appreciative of help. She is trying to get this resolved prior to a hip replacement surgery.

## 2022-01-05 ENCOUNTER — Other Ambulatory Visit: Payer: Self-pay

## 2022-01-05 ENCOUNTER — Ambulatory Visit
Admission: EM | Admit: 2022-01-05 | Discharge: 2022-01-05 | Disposition: A | Discharge status: Home / Self Care | Payer: No Typology Code available for payment source

## 2022-01-05 ENCOUNTER — Encounter: Payer: Self-pay | Admitting: Emergency Medicine

## 2022-01-05 DIAGNOSIS — R109 Unspecified abdominal pain: Secondary | ICD-10-CM

## 2022-01-05 NOTE — ED Provider Notes (Signed)
[facility]    CSN: [id number] Arrival date & time: 01/05/22  1117      History   Chief Complaint Chief Complaint  Patient presents with   Flank Pain    HPI Kaitlyn Jenkins is a 61 y.o. female.   Patient presents with left-sided back/flank pain that started last night.  Patient reports that the pain is constant, achy, and is rated 10/10 on pain scale.  Denies any injury to the area.  Denies urinary burning, urinary frequency, hematuria, vaginal discharge, irregular vaginal bleeding, pelvic pain, abdominal pain, fever.  Patient reports that she has a history of kidney stones where she had received lithotripsy.  She reports that this "feels similar to that".  Patient has taken Tylenol for pain without improvement.    Flank Pain   Past Medical History:  Diagnosis Date   Complication of anesthesia 07/17/2021   O2 stas  dropped to 88 on O2 and ra, improved over time note on chart from [facility] [place]   Elbow injury 07/06/2021   fell on july 3,2022 fell on her elbow , saw orthopedic dr on July 06, 2021, bone chipped off will need surgery   GERD (gastroesophageal reflux disease)    History of kidney stones    Hypothyroidism    PMB (postmenopausal bleeding)    Uterine fibroid    Wears glasses 08/23/2021   Wears partial dentures 08/23/2021   upper    Patient Active Problem List   Diagnosis Date Noted   Fibroids, intramural 05/31/2021   Postmenopausal bleeding 05/31/2021    Past Surgical History:  Procedure Laterality Date   ABDOMINAL HYSTERECTOMY     DILATION AND CURETTAGE OF UTERUS N/A 07/12/2021   Procedure: DILATATION AND CURETTAGE;  Surgeon: Everitt Amber, MD;  Location: [place];  Service: Gynecology;  Laterality: N/A;   EXTRACORPOREAL SHOCK WAVE LITHOTRIPSY  2016   fallopian tubes remove due to scarring  [place] [facility]    right total hip replaced in 2010.   right elbow tendon repair  07/17/2021   @ [facility]   ROBOTIC ASSISTED TOTAL HYSTERECTOMY WITH BILATERAL SALPINGO OOPHERECTOMY Bilateral 08/29/2021   Procedure: XI ROBOTIC ASSISTED TOTAL HYSTERECTOMY WITH BILATERAL SALPINGO OOPHORECTOMY; MINI LAPAROTOMY;  Surgeon: Everitt Amber, MD;  Location: [facility];  Service: Gynecology;  Laterality: Bilateral;    OB History   No obstetric history on file.      Home Medications    Prior to Admission medications   Medication Sig Start Date End Date Taking? Authorizing Provider  esomeprazole (NEXIUM) 20 MG capsule Take 20 mg by mouth every morning.    [provider]  ibuprofen (ADVIL) 800 MG tablet Take 1 tablet (800 mg total) by mouth every 8 (eight) hours as needed for moderate pain. For AFTER surgery only 07/24/21   Joylene John D, NP  levothyroxine (SYNTHROID) 125 MCG tablet Euthyrox 125 mcg tablet    [provider]  Multiple Vitamin (MULTIVITAMIN WITH MINERALS) TABS tablet Take 1 tablet by mouth daily.    [provider]  nitrofurantoin, macrocrystal-monohydrate, (MACROBID) 100 MG capsule Take 1 capsule (100 mg total) by mouth 2 (two) times daily. Patient not taking: Reported on 09/14/2021 08/31/21   Joylene John D, NP  Omega-3 Fatty Acids (FISH OIL PO) Take 1 capsule by mouth daily. Stopped taking and will start after her surgery after checking with her dr. 07/06/21    [provider]  senna-docusate (  SENOKOT-S) 8.6-50 MG tablet Take 2 tablets by mouth at bedtime. For AFTER surgery, do not take if having diarrhea Patient not taking: Reported on 09/14/2021 07/24/21   Joylene John D, NP  traMADol (ULTRAM) 50 MG tablet Take 1 tablet (50 mg total) by mouth every 6 (six) hours as needed for severe pain. For AFTER surgery, do not take and drive Patient not taking: Reported on 09/14/2021 07/24/21   Dorothyann Gibbs, NP    Family History History reviewed. No pertinent family history.  Social History Social History   Tobacco Use    Smoking status: Every Day    Packs/day: 1.00    Years: 38.00    Pack years: 38.00    Types: Cigarettes   Smokeless tobacco: Never  Vaping Use   Vaping Use: Never used  Substance Use Topics   Alcohol use: Yes    Alcohol/week: 4.0 standard drinks    Types: 4 Cans of beer per week    Comment: 4/week   Drug use: No     Allergies   Codeine   Review of Systems Review of Systems Per HPI  Physical Exam Triage Vital Signs ED Triage Vitals  Enc Vitals Group     BP 01/05/22 1128 (!) 149/86     Pulse Rate 01/05/22 1128 (!) 115     Resp 01/05/22 1128 16     Temp 01/05/22 1128 98 F (36.7 C)     Temp Source 01/05/22 1128 Oral     SpO2 01/05/22 1128 94 %     Weight --      Height --      Head Circumference --      Peak Flow --      Pain Score 01/05/22 1129 10     Pain Loc --      Pain Edu? --      Excl. in [place]? --    No data found.  Updated Vital Signs BP (!) 149/86 (BP Location: Left Arm)    Pulse (!) 115    Temp 98 F (36.7 C) (Oral)    Resp 16    SpO2 94%   Visual Acuity Right Eye Distance:   Left Eye Distance:   Bilateral Distance:    Right Eye Near:   Left Eye Near:    Bilateral Near:     Physical Exam Constitutional:      General: She is not in acute distress.    Appearance: Normal appearance. She is not toxic-appearing or diaphoretic.  HENT:     Head: Normocephalic and atraumatic.  Eyes:     Extraocular Movements: Extraocular movements intact.     Conjunctiva/sclera: Conjunctivae normal.  Cardiovascular:     Rate and Rhythm: Normal rate and regular rhythm.     Pulses: Normal pulses.     Heart sounds: Normal heart sounds.  Pulmonary:     Effort: Pulmonary effort is normal. No respiratory distress.     Breath sounds: Normal breath sounds.  Musculoskeletal:     Cervical back: Normal.     Thoracic back: Normal.     Lumbar back: Tenderness present. No swelling, edema or bony tenderness. Negative right straight leg raise test and negative left straight  leg raise test.       Back:     Comments: Tenderness to palpation to left area of back at circled diagram.  No erythema, bruising, lacerations noted.  Neurological:     General: No focal deficit present.     Mental  Status: She is alert and oriented to person, place, and time. Mental status is at baseline.  Psychiatric:        Mood and Affect: Mood normal.        Behavior: Behavior normal.        Thought Content: Thought content normal.        Judgment: Judgment normal.     UC Treatments / Results  Labs (all labs ordered are listed, but only abnormal results are displayed) Labs Reviewed - No data to display  EKG   Radiology No results found.  Procedures Procedures (including critical care time)  Medications Ordered in UC Medications - No data to display  Initial Impression / Assessment and Plan / UC Course  I have reviewed the triage vital signs and the nursing notes.  Pertinent labs & imaging results that were available during my care of the patient were reviewed by me and considered in my medical decision making (see chart for details).     Patient was advised that she would need to go to the hospital for further evaluation and management given severity of pain and the possibility of a kidney stone being present.  Unable to provide CT in urgent care which patient will most likely need to determine etiology of pain.  Patient has also required lithotripsy in the past so further evaluation in the hospital is warranted due to this as well.  Vital signs stable at discharge.  Agree with patient self transport to the hospital.  Patient verbalized understanding and was agreeable with plan. Final Clinical Impressions(s) / UC Diagnoses   Final diagnoses:  Left flank pain     Discharge Instructions      Please go to the emergency department as soon as you leave urgent care for further evaluation and management.    ED Prescriptions   None    PDMP not reviewed this  encounter.   Teodora Medici, [place] 01/05/22 1245

## 2022-01-05 NOTE — ED Triage Notes (Signed)
Left sided CVA pain starting last night. Reports the pain is unbearable. Hx of kidney stones with lithotripsy, states this feels similar. Denies visualizing any blood in the urine

## 2022-01-05 NOTE — ED Notes (Signed)
Patient is being discharged from the Urgent Care and sent to the Emergency Department via POV . Per Towana Badger NP, patient is in need of higher level of care due to rule out nephrolithiasis. Patient is aware and verbalizes understanding of plan of care.  Vitals:   01/05/22 1128  BP: (!) 149/86  Pulse: (!) 115  Resp: 16  Temp: 98 F (36.7 C)  SpO2: 94%

## 2022-01-05 NOTE — Discharge Instructions (Signed)
Please go to the emergency department as soon as you leave urgent care for further evaluation and management. ?

## 2022-01-10 ENCOUNTER — Ambulatory Visit (HOSPITAL_COMMUNITY): Payer: No Typology Code available for payment source

## 2022-01-10 ENCOUNTER — Ambulatory Visit (HOSPITAL_COMMUNITY): Payer: No Typology Code available for payment source | Admitting: Certified Registered Nurse Anesthetist

## 2022-01-10 ENCOUNTER — Encounter (HOSPITAL_COMMUNITY)
Admission: RE | Disposition: A | Discharge status: Home / Self Care | Payer: Self-pay | Source: Ambulatory Visit | Attending: Urology

## 2022-01-10 ENCOUNTER — Encounter (HOSPITAL_COMMUNITY): Payer: Self-pay | Admitting: Urology

## 2022-01-10 ENCOUNTER — Other Ambulatory Visit: Payer: Self-pay | Admitting: Urology

## 2022-01-10 ENCOUNTER — Ambulatory Visit (HOSPITAL_COMMUNITY)
Admission: RE | Admit: 2022-01-10 | Discharge: 2022-01-10 | Disposition: A | Discharge status: Home / Self Care | Payer: No Typology Code available for payment source | Source: Ambulatory Visit | Attending: Urology | Admitting: Urology

## 2022-01-10 DIAGNOSIS — F17210 Nicotine dependence, cigarettes, uncomplicated: Secondary | ICD-10-CM | POA: Insufficient documentation

## 2022-01-10 DIAGNOSIS — N390 Urinary tract infection, site not specified: Secondary | ICD-10-CM | POA: Diagnosis not present

## 2022-01-10 DIAGNOSIS — K219 Gastro-esophageal reflux disease without esophagitis: Secondary | ICD-10-CM | POA: Diagnosis not present

## 2022-01-10 DIAGNOSIS — N201 Calculus of ureter: Secondary | ICD-10-CM | POA: Diagnosis not present

## 2022-01-10 DIAGNOSIS — Z87440 Personal history of urinary (tract) infections: Secondary | ICD-10-CM | POA: Insufficient documentation

## 2022-01-10 HISTORY — PX: CYSTOSCOPY WITH URETEROSCOPY AND STENT PLACEMENT: SHX6377

## 2022-01-10 LAB — CBC
HCT: 40.2 % (ref 36.0–46.0)
Hemoglobin: 13.5 g/dL (ref 12.0–15.0)
MCH: 36.3 pg — ABNORMAL HIGH (ref 26.0–34.0)
MCHC: 33.6 g/dL (ref 30.0–36.0)
MCV: 108.1 fL — ABNORMAL HIGH (ref 80.0–100.0)
Platelets: 177 10*3/uL (ref 150–400)
RBC: 3.72 MIL/uL — ABNORMAL LOW (ref 3.87–5.11)
RDW: 13.6 % (ref 11.5–15.5)
WBC: 11.2 10*3/uL — ABNORMAL HIGH (ref 4.0–10.5)
nRBC: 0 % (ref 0.0–0.2)

## 2022-01-10 LAB — COMPREHENSIVE METABOLIC PANEL
ALT: 20 U/L (ref 0–44)
AST: 24 U/L (ref 15–41)
Albumin: 3.2 g/dL — ABNORMAL LOW (ref 3.5–5.0)
Alkaline Phosphatase: 130 U/L — ABNORMAL HIGH (ref 38–126)
Anion gap: 13 (ref 5–15)
BUN: 12 mg/dL (ref 8–23)
CO2: 22 mmol/L (ref 22–32)
Calcium: 9.1 mg/dL (ref 8.9–10.3)
Chloride: 99 mmol/L (ref 98–111)
Creatinine, Ser: 0.73 mg/dL (ref 0.44–1.00)
GFR, Estimated: >60 mL/min (ref >60)
Glucose, Bld: 131 mg/dL — ABNORMAL HIGH (ref 70–99)
Potassium: 3.1 mmol/L — ABNORMAL LOW (ref 3.5–5.1)
Sodium: 134 mmol/L — ABNORMAL LOW (ref 135–145)
Total Bilirubin: 1.1 mg/dL (ref 0.3–1.2)
Total Protein: 7.5 g/dL (ref 6.5–8.1)

## 2022-01-10 SURGERY — CYSTOURETEROSCOPY, WITH STENT INSERTION
Anesthesia: General | Laterality: Left

## 2022-01-10 MED ORDER — LIDOCAINE 2% (20 MG/ML) 5 ML SYRINGE
INTRAMUSCULAR | Status: DC | PRN
  Administered 2022-01-10: 60 mg via INTRAVENOUS

## 2022-01-10 MED ORDER — FENTANYL CITRATE (PF) 100 MCG/2ML IJ SOLN
INTRAMUSCULAR | Status: AC
  Filled 2022-01-10: qty 2

## 2022-01-10 MED ORDER — IOHEXOL 300 MG/ML  SOLN
INTRAMUSCULAR | Status: DC | PRN
  Administered 2022-01-10: 10 mL

## 2022-01-10 MED ORDER — SODIUM CHLORIDE 0.9 % IR SOLN
Status: DC | PRN
  Administered 2022-01-10: 3000 mL via INTRAVESICAL

## 2022-01-10 MED ORDER — MIDAZOLAM HCL 2 MG/2ML IJ SOLN
INTRAMUSCULAR | Status: DC | PRN
  Administered 2022-01-10: 2 mg via INTRAVENOUS

## 2022-01-10 MED ORDER — LACTATED RINGERS IV SOLN: INTRAVENOUS | Status: DC

## 2022-01-10 MED ORDER — MIDAZOLAM HCL 2 MG/2ML IJ SOLN
INTRAMUSCULAR | Status: AC
  Filled 2022-01-10: qty 2

## 2022-01-10 MED ORDER — PROMETHAZINE HCL 25 MG/ML IJ SOLN: 6.2500 mg | INTRAMUSCULAR | Status: DC | PRN

## 2022-01-10 MED ORDER — FENTANYL CITRATE (PF) 100 MCG/2ML IJ SOLN
INTRAMUSCULAR | Status: DC | PRN
Start: 2022-01-10 — End: 2022-01-10
  Administered 2022-01-10: 50 ug via INTRAVENOUS
  Administered 2022-01-10 (×2): 25 ug via INTRAVENOUS
  Administered 2022-01-10: 50 ug via INTRAVENOUS

## 2022-01-10 MED ORDER — DEXAMETHASONE SODIUM PHOSPHATE 10 MG/ML IJ SOLN
INTRAMUSCULAR | Status: DC | PRN
  Administered 2022-01-10: 5 mg via INTRAVENOUS

## 2022-01-10 MED ORDER — CEFAZOLIN SODIUM-DEXTROSE 2-4 GM/100ML-% IV SOLN
2.0000 g | Freq: Once | INTRAVENOUS | Status: AC
  Administered 2022-01-10: 2 g via INTRAVENOUS
  Filled 2022-01-10: qty 100

## 2022-01-10 MED ORDER — PROPOFOL 10 MG/ML IV BOLUS
INTRAVENOUS | Status: DC | PRN
  Administered 2022-01-10: 50 mg via INTRAVENOUS
  Administered 2022-01-10: 150 mg via INTRAVENOUS

## 2022-01-10 MED ORDER — ACETAMINOPHEN 10 MG/ML IV SOLN: 1000.0000 mg | Freq: Once | INTRAVENOUS | Status: DC | PRN

## 2022-01-10 MED ORDER — PROPOFOL 10 MG/ML IV BOLUS
INTRAVENOUS | Status: AC
  Filled 2022-01-10: qty 20

## 2022-01-10 MED ORDER — ONDANSETRON HCL 4 MG/2ML IJ SOLN
INTRAMUSCULAR | Status: DC | PRN
  Administered 2022-01-10: 4 mg via INTRAVENOUS

## 2022-01-10 SURGICAL SUPPLY — 21 items
BAG URO CATCHER STRL LF (MISCELLANEOUS) ×2 IMPLANT
BASKET ZERO TIP NITINOL 2.4FR (BASKET) IMPLANT
BSKT STON RTRVL ZERO TP 2.4FR (BASKET)
CATH URET 5FR 28IN CONE TIP (BALLOONS)
CATH URET 5FR 70CM CONE TIP (BALLOONS) IMPLANT
CATH URETL OPEN END 6FR 70 (CATHETERS) ×2 IMPLANT
CLOTH BEACON ORANGE TIMEOUT ST (SAFETY) ×2 IMPLANT
GLOVE SURG ENC MOIS LTX SZ7.5 (GLOVE) ×2 IMPLANT
GOWN STRL REUS W/TWL XL LVL3 (GOWN DISPOSABLE) ×2 IMPLANT
GUIDEWIRE STR DUAL SENSOR (WIRE) ×2 IMPLANT
KIT TURNOVER KIT A (KITS) IMPLANT
LASER FIB FLEXIVA PULSE ID 365 (Laser) IMPLANT
MANIFOLD NEPTUNE II (INSTRUMENTS) ×2 IMPLANT
PACK CYSTO (CUSTOM PROCEDURE TRAY) ×2 IMPLANT
SHEATH NAVIGATOR HD 11/13X28 (SHEATH) IMPLANT
SHEATH NAVIGATOR HD 11/13X36 (SHEATH) IMPLANT
STENT URET 6FRX24 CONTOUR (STENTS) ×1 IMPLANT
TRACTIP FLEXIVA PULS ID 200XHI (Laser) IMPLANT
TRACTIP FLEXIVA PULSE ID 200 (Laser)
TUBING CONNECTING 10 (TUBING) ×2 IMPLANT
TUBING UROLOGY SET (TUBING) ×2 IMPLANT

## 2022-01-10 NOTE — Anesthesia Procedure Notes (Signed)
Procedure Name: LMA Insertion Date/Time: 01/10/2022 4:15 PM Performed by: Lollie Sails, CRNA Pre-anesthesia Checklist: Patient identified, Emergency Drugs available, Suction available, Patient being monitored and Timeout performed Patient Re-evaluated:Patient Re-evaluated prior to induction Oxygen Delivery Method: Circle system utilized Preoxygenation: Pre-oxygenation with 100% oxygen Induction Type: IV induction Ventilation: Mask ventilation without difficulty LMA: LMA inserted LMA Size: 4.0 Number of attempts: 1 Placement Confirmation: positive ETCO2 and breath sounds checked- equal and bilateral Tube secured with: Tape Dental Injury: Teeth and Oropharynx as per pre-operative assessment

## 2022-01-10 NOTE — H&P (Signed)
Office Visit Report     01/10/2022   --------------------------------------------------------------------------------   Missy Sabins. Selvy  MRN: [id number]  DOB: Aug 29, 1961, 61 year old Female  SSN: -**[name]   PRIMARY CARE:  L Donnie Coffin, MD  REFERRING:    PROVIDER:  Olene Craven. Cain Sieve, M.D.  LOCATION:  [facility] [phone]     --------------------------------------------------------------------------------   CC/HPI: Ms Leonardo is a 61 yo F who presents today to discuss a possible kidney stone.   Ms. Wiker describes waking up early Saturday morning with extreme left flank pain. She also describes having chills at that time. She went to an urgent care clinic later that day but unfortunately did not have imaging capabilities and no other lab testing was performed.   Ms. Garson had a nitrite positive urinalysis yesterday at her primary care office and was started on Cipro. She reports she had a E. coli urinary tract infection found on December 28 when she presented to her primary care office with urgency/frequency and dysuria.   Ms. Lebo had also wishes to discuss urinary urgency today. She reports he had a cystectomy in September and since that time has had daily incontinence episodes necessitating the use of a depend. She denies any leakage with stress.   The patient has a history of stones. She had ESWL in 2014 and has not had any trouble since then.       ALLERGIES: Codine    MEDICATIONS: Calcium 600+D TABS 0 Oral  Centrum Silver TABS 0 Oral  Fish Oil CAPS Oral  Levothyroxine Sodium 100 MCG Oral Tablet Oral  Multivitamin  NexIUM 24HR 20 MG Oral Capsule Delayed Release Oral  Potassium Citrate Er 15 meq (1,620 mg) tablet, extended release 1 Oral Twice daily  Urocit-K 15 meq (1,620 mg) tablet, extended release 1 Oral     GU PSH: ESWL - 2014 Remove Fallopian Tube - 2014       PSH Notes: Lithotripsy, Salpingectomy, Total Hip Replacement   NON-GU PSH:  Elbow Surgery (Unspecified) Hysterectomy Total Hip Replacement - 2014     GU PMH: Hypocitraturia, Hypocitraturia - 2015 Renal calculus, Nephrolithiasis - 2015 Adrenal mass Unspec, Adrenal cortical adenoma, unspecified laterality - 2014 Ureteral calculus, Calculus of ureter - 2014    NON-GU PMH: Encounter for general adult medical examination without abnormal findings, Encounter for preventive health examination - 2014 Personal history of other diseases of the digestive system, History of esophageal reflux - 2014 Personal history of other endocrine, nutritional and metabolic disease, History of hypothyroidism - 2014 Hypothyroidism    FAMILY HISTORY: Death In The Family Father - Runs In Family Death In The Family Mother - Runs In Family pneumonia - Father Suicide Completion - Mother   SOCIAL HISTORY: Marital Status: Married Current Smoking Status: Patient smokes. Has smoked since 12/23/1978. Smokes 1 pack per day.   Tobacco Use Assessment Completed: Used Tobacco in last 30 days? Does not use smokeless tobacco. Drinks 3 drinks per day.  Does not use drugs. Drinks 2 caffeinated drinks per day.     Notes: Current every day smoker, Caffeine Use, Alcohol Use, Marital History - Currently Married, Occupation:   REVIEW OF SYSTEMS:    GU Review Female:   Patient reports hard to postpone urination, get up at night to urinate, and leakage of urine. Patient denies frequent urination, burning /pain with urination, stream starts and stops, trouble starting your stream, have to strain to urinate, and being pregnant.  Gastrointestinal (Upper):   Patient reports  nausea. Patient denies vomiting and indigestion/ heartburn.  Gastrointestinal (Lower):   Patient denies diarrhea and constipation.  Constitutional:   Patient reports fever, night sweats, weight loss, and fatigue.   Skin:   Patient denies skin rash/ lesion and itching.  Eyes:   Patient denies blurred vision and double vision.  Ears/  Nose/ Throat:   Patient denies sore throat and sinus problems.  Hematologic/Lymphatic:   Patient denies swollen glands and easy bruising.  Cardiovascular:   Patient denies leg swelling and chest pains.  Respiratory:   Patient reports cough. Patient denies shortness of breath.  Endocrine:   Patient denies excessive thirst.  Musculoskeletal:   Patient denies back pain and joint pain.  Neurological:   Patient reports headaches and dizziness.   Psychologic:   Patient denies depression and anxiety.   VITAL SIGNS:      01/10/2022 08:40 AM  Weight 140 lb / 63.5 kg  Height 63 in / 160.02 cm  BP 130/81 mmHg  Pulse 97 /min  Temperature 97.3 F / 36.2 C  BMI 24.8 kg/m   MULTI-SYSTEM PHYSICAL EXAMINATION:    Constitutional: Well-nourished. No physical deformities. Normally developed. Good grooming.  Neck: Neck symmetrical, not swollen. Normal tracheal position.  Respiratory: No labored breathing, no use of accessory muscles.   Cardiovascular: Normal temperature, normal extremity pulses, no swelling, no varicosities.  Lymphatic: No enlargement of neck, axillae, groin.  Skin: No paleness, no jaundice, no cyanosis. No lesion, no ulcer, no rash.  Neurologic / Psychiatric: Oriented to time, oriented to place, oriented to person. No depression, no anxiety, no agitation.  Gastrointestinal: No mass, no tenderness, no rigidity, non obese abdomen.  Eyes: Normal conjunctivae. Normal eyelids.  Ears, Nose, Mouth, and Throat: Left ear no scars, no lesions, no masses. Right ear no scars, no lesions, no masses. Nose no scars, no lesions, no masses. Normal hearing. Normal lips.  Musculoskeletal: Normal gait and station of head and neck.     Complexity of Data:  Source Of History:  Patient  Records Review:   Previous Patient Records  Urine Test Review:   Urinalysis  X-Ray Review: C.T. Abdomen/Pelvis: Reviewed Films. Discussed With Patient.     PROCEDURES:         C.T. Urogram - [id number]  Left Kidney/Ureter:   There is a large, 1.8 cm obstructing stone in the left UPJ. There is some associated hydronephrosis. Additionally left kidney appears somewhat edematous when compared to the right kidney. There also appears to be a small nonobstructing stone in the left kidney.      Patient confirmed No Neulasta OnPro Device.         Urinalysis w/Scope - 81001 Dipstick Dipstick Cont'd Micro  Color: Amber Bilirubin: Neg mg/dL WBC/hpf: 40 - 60/hpf  Appearance: Cloudy Ketones: Neg mg/dL RBC/hpf: 0 - 2/hpf  Specific Gravity: 1.020 Blood: 1+ ery/uL Bacteria: Mod (26-50/hpf)  pH: 5.5 Protein: 2+ mg/dL Cystals: NS (Not Seen)  Glucose: Neg mg/dL Urobilinogen: 0.2 mg/dL Casts: NS (Not Seen)    Nitrites: Neg Trichomonas: Not Present    Leukocyte Esterase: 3+ leu/uL Mucous: Not Present      Epithelial Cells: 0 - 5/hpf      Yeast: NS (Not Seen)      Sperm: Not Present     01/10/22  Urinalysis  Urine Appearance Cloudy   Urine Color Amber   Urine Glucose Neg mg/dL  Urine Bilirubin Neg mg/dL  Urine Ketones Neg mg/dL  Urine Specific Gravity 1.020   Urine Blood 1+  ery/uL  Urine pH 5.5   Urine Protein 2+ mg/dL  Urine Urobilinogen 0.2 mg/dL  Urine Nitrites Neg   Urine Leukocyte Esterase 3+ leu/uL  Urine WBC/hpf 40 - 60/hpf   Urine RBC/hpf 0 - 2/hpf   Urine Epithelial Cells 0 - 5/hpf   Urine Bacteria Mod (26-50/hpf)   Urine Mucous Not Present   Urine Yeast NS (Not Seen)   Urine Trichomonas Not Present   Urine Cystals NS (Not Seen)   Urine Casts NS (Not Seen)   Urine Sperm Not Present    ASSESSMENT:      ICD-10 Details  1 GU:   Ureteral calculus - N20.1 Left, Acute, Threat to Bodily Function  2   Flank Pain - R10.84 Left, Acute, Systemic Symptoms   PLAN:           Orders Labs Urine Culture  X-Rays: C.T. Stone Protocol Without I.V. Contrast          Schedule         Document Letter(s):  Created for Patient: Clinical Summary         Notes:   As mentioned above Ms. Hernan has a large  obstructing stone in the left UPJ. Given her recent history of urinary tract infection and a nitrite positive urinalysis yesterday I have concerns regarding a superimposed urinary tract infection as well. I spoke with Dr. Junious Silk who is on-call and he will place a stent and Ms. Fier urgently today. We discussed this procedure in detail, including there is a possibility a stent would not be able to be placed given the large size of the stone in which case a nephrostomy tube will need to be placed. Ms. Lame voiced understanding   We discussed after the stent's placed the next up would be ureteroscopy. We reviewed this procedure in detail including risks. We discussed that given the size of the stone there is a good chance it would require 2 trips to the operating room. Again Ms. Borys voiced understanding. all questions answered    * Signed by Phillip Heal L. Cain Sieve, M.D. on 01/10/22 at 12:37 PM (EST)*      The information contained in this medical record document is considered private and confidential patient information. This information can only be used for the medical diagnosis and/or medical services that are being provided by the patient's selected caregivers. This information can only be distributed outside of the patient's care if the patient agrees and signs waivers of authorization for this information to be sent to an outside source or route.

## 2022-01-10 NOTE — Transfer of Care (Signed)
Immediate Anesthesia Transfer of Care Note  Patient: Kaitlyn Jenkins  Procedure(s) Performed: CYSTOSCOPY WITH  AND STENT PLACEMENT (Left)  Patient Location: PACU  Anesthesia Type:General  Level of Consciousness: awake, alert  and patient cooperative  Airway & Oxygen Therapy: Patient Spontanous Breathing  Post-op Assessment: Report given to RN and Post -op Vital signs reviewed and stable  Post vital signs: Reviewed and stable  Last Vitals:  Vitals Value Taken Time  BP 110/69 01/10/22 1642  Temp    Pulse 94 01/10/22 1643  Resp 14 01/10/22 1643  SpO2 95 % 01/10/22 1643  Vitals shown include unvalidated device data.  Last Pain:  Vitals:   01/10/22 1343  TempSrc:   PainSc: 0-No pain         Complications: No notable events documented.

## 2022-01-10 NOTE — Op Note (Signed)
Preoperative diagnosis: Left UPJ stone, UTI Postoperative diagnosis: Same  Procedure: Cystoscopy with left retrograde pyelogram, left ureteral stent placement  Surgeon: Junious Silk  Anesthesia: General  Indication for procedure: Kaitlyn Jenkins is a 61 year old female she has had a history of some chill-like symptoms and bacteriuria.  She also had some flank pain and CT scan today revealed a sizable left UPJ stone.  Dr. Cain Sieve discussed the patient with me and I brought for urgent stent given the fact that the stone is large, would not pass, likely need a staged procedure and possible UTI.  I discussed with the patient the nature risk benefits and alternatives to the procedure.  We discussed the rationale for a staged procedure and the importance of follow-up.  All questions answered.  She elected to proceed.  Findings: On cystoscopy the urethra and the bladder are unremarkable.  No mucosal lesions.  No stone or foreign body in the bladder.  Left retrograde pyelogram-on scout imaging the stone was visible in the left UPJ.  On retrograde injection of contrast the ureter appeared normal without filling defect or stricture and then large filling defect up at the UPJ/proximal ureter/renal pelvis consistent with the stone and dilation of the remainder of the collecting system and calyces.  Description of procedure: After consent was obtained patient brought to the operating room.  After adequate anesthesia she was placed lithotomy position and prepped and draped in the usual sterile fashion.  Timeout was performed to current the patient and procedure.  Cystoscope was passed per urethra and the bladder carefully inspected.  Open-ended catheter was inserted into the left ureteral orifice and left retrograde injection of contrast was performed.  I then passed a sensor wire past the stone and coiled that in an upper calyx.  I then passed a 624 cm stent over the wire and remove the wire.  There was a good coil up above  the stone and I made sure the stent went up into the upper calyx.  And a good coil in the bladder.  The stent appeared to be draining well in the bladder.  She was then awakened taken the cover room in stable condition.    Complications: None  Blood loss: Minimal  Specimens: None  Drains: 6 x 24 cm left ureteral stent without string  Disposition: Patient stable to PACU-I discussed with Kaitlyn Jenkins the procedure, postop care and follow-up.

## 2022-01-10 NOTE — H&P (View-Only) (Signed)
Office Visit Report     01/10/2022   --------------------------------------------------------------------------------   Kaitlyn Jenkins  MRN: [id number]  DOB: January 03, 1961, 61 year old Female  SSN: -**[id number] Jenkins   PRIMARY CARE:  L Donnie Coffin, MD  REFERRING:    PROVIDER:  Olene Craven. Cain Sieve, M.D.  LOCATION:  [facility] [phone]     --------------------------------------------------------------------------------   CC/HPI: Kaitlyn Jenkins is a 61 yo F who presents today to discuss a possible kidney stone.   Kaitlyn Jenkins describes waking up early Saturday morning with extreme left flank pain. She also describes having chills at that time. She went to an urgent care clinic later that day but unfortunately did not have imaging capabilities and no other lab testing was performed.   Kaitlyn Jenkins had a nitrite positive urinalysis yesterday at her primary care office and was started on Cipro. She reports she had a E. coli urinary tract infection found on December 28 when she presented to her primary care office with urgency/frequency and dysuria.   Kaitlyn Jenkins had also wishes to discuss urinary urgency today. She reports he had a cystectomy in September and since that time has had daily incontinence episodes necessitating the use of a depend. She denies any leakage with stress.   The patient has a history of stones. She had ESWL in 2014 and has not had any trouble since then.       ALLERGIES: Codine    MEDICATIONS: Calcium 600+D TABS 0 Oral  Centrum Silver TABS 0 Oral  Fish Oil CAPS Oral  Levothyroxine Sodium 100 MCG Oral Tablet Oral  Multivitamin  NexIUM 24HR 20 MG Oral Capsule Delayed Release Oral  Potassium Citrate Er 15 meq (1,620 mg) tablet, extended release 1 Oral Twice daily  Urocit-K 15 meq (1,620 mg) tablet, extended release 1 Oral     GU PSH: ESWL - 2014 Remove Fallopian Tube - 2014       PSH Notes: Lithotripsy, Salpingectomy, Total Hip Replacement   NON-GU PSH:  Elbow Surgery (Unspecified) Hysterectomy Total Hip Replacement - 2014     GU PMH: Hypocitraturia, Hypocitraturia - 2015 Renal calculus, Nephrolithiasis - 2015 Adrenal mass Unspec, Adrenal cortical adenoma, unspecified laterality - 2014 Ureteral calculus, Calculus of ureter - 2014    NON-GU PMH: Encounter for general adult medical examination without abnormal findings, Encounter for preventive health examination - 2014 Personal history of other diseases of the digestive system, History of esophageal reflux - 2014 Personal history of other endocrine, nutritional and metabolic disease, History of hypothyroidism - 2014 Hypothyroidism    FAMILY HISTORY: Death In The Family Father - Runs In Family Death In The Family Mother - Runs In Family pneumonia - Father Suicide Completion - Mother   SOCIAL HISTORY: Marital Status: Married Current Smoking Status: Patient smokes. Has smoked since 12/23/1978. Smokes 1 pack per day.   Tobacco Use Assessment Completed: Used Tobacco in last 30 days? Does not use smokeless tobacco. Drinks 3 drinks per day.  Does not use drugs. Drinks 2 caffeinated drinks per day.     Notes: Current every day smoker, Caffeine Use, Alcohol Use, Marital History - Currently Married, Occupation:   REVIEW OF SYSTEMS:    GU Review Female:   Patient reports hard to postpone urination, get up at night to urinate, and leakage of urine. Patient denies frequent urination, burning /pain with urination, stream starts and stops, trouble starting your stream, have to strain to urinate, and being pregnant.  Gastrointestinal (Upper):   Patient reports  nausea. Patient denies vomiting and indigestion/ heartburn.  Gastrointestinal (Lower):   Patient denies diarrhea and constipation.  Constitutional:   Patient reports fever, night sweats, weight loss, and fatigue.   Skin:   Patient denies skin rash/ lesion and itching.  Eyes:   Patient denies blurred vision and double vision.  Ears/  Nose/ Throat:   Patient denies sore throat and sinus problems.  Hematologic/Lymphatic:   Patient denies swollen glands and easy bruising.  Cardiovascular:   Patient denies leg swelling and chest pains.  Respiratory:   Patient reports cough. Patient denies shortness of breath.  Endocrine:   Patient denies excessive thirst.  Musculoskeletal:   Patient denies back pain and joint pain.  Neurological:   Patient reports headaches and dizziness.   Psychologic:   Patient denies depression and anxiety.   VITAL SIGNS:      01/10/2022 08:40 AM  Weight 140 lb / 63.5 kg  Height 63 in / 160.02 cm  BP 130/81 mmHg  Pulse 97 /min  Temperature 97.3 F / 36.2 C  BMI 24.8 kg/m   MULTI-SYSTEM PHYSICAL EXAMINATION:    Constitutional: Well-nourished. No physical deformities. Normally developed. Good grooming.  Neck: Neck symmetrical, not swollen. Normal tracheal position.  Respiratory: No labored breathing, no use of accessory muscles.   Cardiovascular: Normal temperature, normal extremity pulses, no swelling, no varicosities.  Lymphatic: No enlargement of neck, axillae, groin.  Skin: No paleness, no jaundice, no cyanosis. No lesion, no ulcer, no rash.  Neurologic / Psychiatric: Oriented to time, oriented to place, oriented to person. No depression, no anxiety, no agitation.  Gastrointestinal: No mass, no tenderness, no rigidity, non obese abdomen.  Eyes: Normal conjunctivae. Normal eyelids.  Ears, Nose, Mouth, and Throat: Left ear no scars, no lesions, no masses. Right ear no scars, no lesions, no masses. Nose no scars, no lesions, no masses. Normal hearing. Normal lips.  Musculoskeletal: Normal gait and station of head and neck.     Complexity of Data:  Source Of History:  Patient  Records Review:   Previous Patient Records  Urine Test Review:   Urinalysis  X-Ray Review: C.T. Abdomen/Pelvis: Reviewed Films. Discussed With Patient.     PROCEDURES:         C.T. Urogram - [id number]  Left Kidney/Ureter:   There is a large, 1.8 cm obstructing stone in the left UPJ. There is some associated hydronephrosis. Additionally left kidney appears somewhat edematous when compared to the right kidney. There also appears to be a small nonobstructing stone in the left kidney.      Patient confirmed No Neulasta OnPro Device.         Urinalysis w/Scope - 81001 Dipstick Dipstick Cont'd Micro  Color: Amber Bilirubin: Neg mg/dL WBC/hpf: 40 - 60/hpf  Appearance: Cloudy Ketones: Neg mg/dL RBC/hpf: 0 - 2/hpf  Specific Gravity: 1.020 Blood: 1+ ery/uL Bacteria: Mod (26-50/hpf)  pH: 5.5 Protein: 2+ mg/dL Cystals: NS (Not Seen)  Glucose: Neg mg/dL Urobilinogen: 0.2 mg/dL Casts: NS (Not Seen)    Nitrites: Neg Trichomonas: Not Present    Leukocyte Esterase: 3+ leu/uL Mucous: Not Present      Epithelial Cells: 0 - 5/hpf      Yeast: NS (Not Seen)      Sperm: Not Present     01/10/22  Urinalysis  Urine Appearance Cloudy   Urine Color Amber   Urine Glucose Neg mg/dL  Urine Bilirubin Neg mg/dL  Urine Ketones Neg mg/dL  Urine Specific Gravity 1.020   Urine Blood 1+  ery/uL  Urine pH 5.5   Urine Protein 2+ mg/dL  Urine Urobilinogen 0.2 mg/dL  Urine Nitrites Neg   Urine Leukocyte Esterase 3+ leu/uL  Urine WBC/hpf 40 - 60/hpf   Urine RBC/hpf 0 - 2/hpf   Urine Epithelial Cells 0 - 5/hpf   Urine Bacteria Mod (26-50/hpf)   Urine Mucous Not Present   Urine Yeast NS (Not Seen)   Urine Trichomonas Not Present   Urine Cystals NS (Not Seen)   Urine Casts NS (Not Seen)   Urine Sperm Not Present    ASSESSMENT:      ICD-10 Details  1 GU:   Ureteral calculus - N20.1 Left, Acute, Threat to Bodily Function  2   Flank Pain - R10.84 Left, Acute, Systemic Symptoms   PLAN:           Orders Labs Urine Culture  X-Rays: C.T. Stone Protocol Without I.V. Contrast          Schedule         Document Letter(s):  Created for Patient: Clinical Summary         Notes:   As mentioned above Kaitlyn Jenkins has a large  obstructing stone in the left UPJ. Given her recent history of urinary tract infection and a nitrite positive urinalysis yesterday I have concerns regarding a superimposed urinary tract infection as well. I spoke with Dr. Junious Silk who is on-call and he will place a stent and Kaitlyn Jenkins urgently today. We discussed this procedure in detail, including there is a possibility a stent would not be able to be placed given the large size of the stone in which case a nephrostomy tube will need to be placed. Kaitlyn Jenkins voiced understanding   We discussed after the stent's placed the next up would be ureteroscopy. We reviewed this procedure in detail including risks. We discussed that given the size of the stone there is a good chance it would require 2 trips to the operating room. Again Kaitlyn Jenkins voiced understanding. all questions answered    * Signed by Phillip Heal L. Cain Sieve, M.D. on 01/10/22 at 12:37 PM (EST)*      The information contained in this medical record document is considered private and confidential patient information. This information can only be used for the medical diagnosis and/or medical services that are being provided by the patient's selected caregivers. This information can only be distributed outside of the patient's care if the patient agrees and signs waivers of authorization for this information to be sent to an outside source or route.

## 2022-01-10 NOTE — Interval H&P Note (Signed)
History and Physical Interval Note:  01/10/2022 4:02 PM  Kaitlyn Jenkins  has presented today for surgery, with the diagnosis of LEFT URETERAL STONE, UTI.  The various methods of treatment have been discussed with the patient and family. After consideration of risks, benefits and other options for treatment, the patient has consented to  Procedure(s): CYSTOSCOPY WITH URETEROSCOPY AND STENT PLACEMENT (Left) as a surgical intervention.  The patient's history has been reviewed, patient examined, no change in status, stable for surgery. She's remained well with no fever or chills today.  I have reviewed the patient's chart, imaging and labs.  I discussed with Dr. Cain Sieve. Questions were answered to the patient's satisfaction.     Festus Aloe

## 2022-01-10 NOTE — Anesthesia Postprocedure Evaluation (Signed)
Anesthesia Post Note  Patient: Kaitlyn Jenkins  Procedure(s) Performed: CYSTOSCOPY WITH  AND STENT PLACEMENT (Left)     Patient location during evaluation: PACU Anesthesia Type: General Level of consciousness: awake and alert Pain management: pain level controlled Vital Signs Assessment: post-procedure vital signs reviewed and stable Respiratory status: spontaneous breathing, nonlabored ventilation, respiratory function stable and patient connected to nasal cannula oxygen Cardiovascular status: blood pressure returned to baseline and stable Postop Assessment: no apparent nausea or vomiting Anesthetic complications: no   No notable events documented.  Last Vitals:  Vitals:   01/10/22 1700 01/10/22 1710  BP: 112/78 123/68  Pulse: 95   Resp: 16 18  Temp: 36.5 C   SpO2: 95% 98%    Last Pain:  Vitals:   01/10/22 1700  TempSrc:   PainSc: 0-No pain                 Barnet Glasgow

## 2022-01-10 NOTE — Anesthesia Preprocedure Evaluation (Addendum)
Anesthesia Evaluation  Patient identified by MRN, date of birth, ID band Patient awake    Reviewed: Allergy & Precautions, NPO status , Patient's Chart, lab work & pertinent test results  Airway Mallampati: II  TM Distance: >3 FB Neck ROM: Full    Dental no notable dental hx. (+) Teeth Intact, Dental Advisory Given   Pulmonary neg pulmonary ROS, Current SmokerPatient did not abstain from smoking.,    Pulmonary exam normal breath sounds clear to auscultation       Cardiovascular negative cardio ROS Normal cardiovascular exam Rhythm:Regular Rate:Normal     Neuro/Psych negative neurological ROS  negative psych ROS   GI/Hepatic Neg liver ROS, GERD  Medicated,  Endo/Other  Hypothyroidism   Renal/GU Left ureteral stone Lab Results      Component                Value               Date                      CREATININE               0.73                01/10/2022                BUN                      12                  01/10/2022                       K                        3.1 (L)             01/10/2022                   fibroids    Musculoskeletal negative musculoskeletal ROS (+)   Abdominal   Peds  Hematology negative hematology ROS (+) Lab Results      Component                Value               Date                      WBC                      11.2 (H)            01/10/2022                HGB                      13.5                01/10/2022                HCT                      40.2                01/10/2022                    PLT  177                 01/10/2022              Anesthesia Other Findings All: codiene  Reproductive/Obstetrics                            Anesthesia Physical Anesthesia Plan  ASA: 2  Anesthesia Plan: General   Post-op Pain Management:    Induction: Intravenous  PONV Risk Score and Plan: 2 and Ondansetron, Dexamethasone,  Midazolam and Treatment may vary due to age or medical condition  Airway Management Planned: LMA  Additional Equipment: None  Intra-op Plan:   Post-operative Plan: Extubation in OR  Informed Consent: I have reviewed the patients History and Physical, chart, labs and discussed the procedure including the risks, benefits and alternatives for the proposed anesthesia with the patient or authorized representative who has indicated his/her understanding and acceptance.     Dental advisory given  Plan Discussed with: CRNA and Anesthesiologist  Anesthesia Plan Comments: (Lab Results      Component                Value               Date                      WBC                      6.5                 08/28/2021                HGB                      16.5 (H)            08/28/2021                HCT                      45.6                08/28/2021                MCV                      104.1 (H)           08/28/2021                PLT                      265                 08/28/2021           Lab Results      Component                Value               Date                      NA                       140  08/28/2021                K                        3.8                 08/28/2021                CO2                      23                  08/28/2021                GLUCOSE                  102 (H)             08/28/2021                BUN                      7                   08/28/2021                CREATININE               0.51                08/28/2021                CALCIUM                  9.8                 08/28/2021                GFRNONAA                 >60                 08/28/2021          )       Anesthesia Quick Evaluation

## 2022-01-11 ENCOUNTER — Encounter (HOSPITAL_COMMUNITY): Payer: Self-pay | Admitting: Urology

## 2022-01-14 ENCOUNTER — Other Ambulatory Visit: Payer: Self-pay | Admitting: Urology

## 2022-01-18 ENCOUNTER — Other Ambulatory Visit: Payer: Self-pay

## 2022-01-18 ENCOUNTER — Encounter (HOSPITAL_BASED_OUTPATIENT_CLINIC_OR_DEPARTMENT_OTHER): Payer: Self-pay | Admitting: Urology

## 2022-01-18 NOTE — Progress Notes (Signed)
Spoke w/ via phone for pre-op interview--- pt Lab needs dos---- ask mda dos if wants istat since on 01-10-2022 lab in epic potassium was 3.1 , entered order              Lab results------ current lab work done 01-10-2022 results in [place] test -----patient states asymptomatic no test needed Arrive at ------- 0730 on 01-23-2022 NPO after MN NO Solid Food.  Clear liquids from MN until--- 0630 Med rec completed Medications to take morning of surgery ----- synthroid, nexium Diabetic medication ----- Patient instructed no nail polish to be worn day of surgery Patient instructed to bring photo id and insurance card day of surgery Patient aware to have Driver (ride ) / caregiver for 24 hours after surgery --husband, greg kelly Patient Special Instructions ----- n/a Pre-Op special Istructions ----- n/a Patient verbalized understanding of instructions that were given at this phone interview. Patient denies shortness of breath, chest pain, fever, cough at this phone interview.

## 2022-01-22 NOTE — Anesthesia Preprocedure Evaluation (Addendum)
Anesthesia Evaluation  Patient identified by MRN, date of birth, ID band Patient awake    Reviewed: Allergy & Precautions, NPO status , Patient's Chart, lab work & pertinent test results  History of Anesthesia Complications Negative for: history of anesthetic complications  Airway Mallampati: II  TM Distance: >3 FB Neck ROM: Full    Dental  (+) Partial Upper, Dental Advisory Given   Pulmonary Current Smoker,    Pulmonary exam normal        Cardiovascular negative cardio ROS Normal cardiovascular exam     Neuro/Psych negative neurological ROS  negative psych ROS   GI/Hepatic Neg liver ROS, hiatal hernia, GERD  Medicated,  Endo/Other  Hypothyroidism   Renal/GU Left ureteral stone Lab Results      Component                Value               Date                      CREATININE               0.73                01/10/2022                BUN                      12                  01/10/2022                       K                        3.1 (L)             01/10/2022                   fibroids    Musculoskeletal negative musculoskeletal ROS (+)   Abdominal   Peds  Hematology negative hematology ROS (+) Lab Results      Component                Value               Date                      WBC                      11.2 (H)            01/10/2022                HGB                      13.5                01/10/2022                HCT                      40.2                01/10/2022                    PLT  177                 01/10/2022              Anesthesia Other Findings   Reproductive/Obstetrics                           Anesthesia Physical  Anesthesia Plan  ASA: 2  Anesthesia Plan: General   Post-op Pain Management:    Induction: Intravenous  PONV Risk Score and Plan: 2 and Ondansetron, Dexamethasone, Midazolam and Treatment may vary due to age or  medical condition  Airway Management Planned: LMA  Additional Equipment: None  Intra-op Plan:   Post-operative Plan: Extubation in OR  Informed Consent: I have reviewed the patients History and Physical, chart, labs and discussed the procedure including the risks, benefits and alternatives for the proposed anesthesia with the patient or authorized representative who has indicated his/her understanding and acceptance.     Dental advisory given  Plan Discussed with: Anesthesiologist and CRNA  Anesthesia Plan Comments: (    )       Anesthesia Quick Evaluation

## 2022-01-23 ENCOUNTER — Encounter (HOSPITAL_BASED_OUTPATIENT_CLINIC_OR_DEPARTMENT_OTHER)
Admission: RE | Disposition: A | Discharge status: Home / Self Care | Payer: Self-pay | Source: Home / Self Care | Attending: Urology

## 2022-01-23 ENCOUNTER — Ambulatory Visit (HOSPITAL_BASED_OUTPATIENT_CLINIC_OR_DEPARTMENT_OTHER): Payer: No Typology Code available for payment source | Admitting: Anesthesiology

## 2022-01-23 ENCOUNTER — Ambulatory Visit (HOSPITAL_BASED_OUTPATIENT_CLINIC_OR_DEPARTMENT_OTHER)
Admission: RE | Admit: 2022-01-23 | Discharge: 2022-01-23 | Disposition: A | Discharge status: Home / Self Care | Payer: No Typology Code available for payment source | Attending: Urology | Admitting: Urology

## 2022-01-23 ENCOUNTER — Encounter (HOSPITAL_BASED_OUTPATIENT_CLINIC_OR_DEPARTMENT_OTHER): Payer: Self-pay | Admitting: Urology

## 2022-01-23 DIAGNOSIS — Z87440 Personal history of urinary (tract) infections: Secondary | ICD-10-CM | POA: Diagnosis not present

## 2022-01-23 DIAGNOSIS — K219 Gastro-esophageal reflux disease without esophagitis: Secondary | ICD-10-CM | POA: Diagnosis not present

## 2022-01-23 DIAGNOSIS — E039 Hypothyroidism, unspecified: Secondary | ICD-10-CM | POA: Insufficient documentation

## 2022-01-23 DIAGNOSIS — F17210 Nicotine dependence, cigarettes, uncomplicated: Secondary | ICD-10-CM | POA: Diagnosis not present

## 2022-01-23 DIAGNOSIS — N202 Calculus of kidney with calculus of ureter: Secondary | ICD-10-CM | POA: Diagnosis present

## 2022-01-23 DIAGNOSIS — N201 Calculus of ureter: Secondary | ICD-10-CM | POA: Diagnosis not present

## 2022-01-23 HISTORY — PX: CYSTOSCOPY/URETEROSCOPY/HOLMIUM LASER/STENT PLACEMENT: SHX6546

## 2022-01-23 HISTORY — DX: Personal history of other benign neoplasm: Z86.018

## 2022-01-23 HISTORY — DX: Calculus of ureter: N20.1

## 2022-01-23 HISTORY — DX: Diaphragmatic hernia without obstruction or gangrene: K44.9

## 2022-01-23 HISTORY — DX: Other chronic pain: G89.29

## 2022-01-23 LAB — POCT I-STAT, CHEM 8
BUN: 5 mg/dL — ABNORMAL LOW (ref 8–23)
Calcium, Ion: 1.04 mmol/L — ABNORMAL LOW (ref 1.15–1.40)
Chloride: 109 mmol/L (ref 98–111)
Creatinine, Ser: 0.5 mg/dL (ref 0.44–1.00)
Glucose, Bld: 97 mg/dL (ref 70–99)
HCT: 40 % (ref 36.0–46.0)
Hemoglobin: 13.6 g/dL (ref 12.0–15.0)
Potassium: 3.4 mmol/L — ABNORMAL LOW (ref 3.5–5.1)
Sodium: 144 mmol/L (ref 135–145)
TCO2: 23 mmol/L (ref 22–32)

## 2022-01-23 SURGERY — CYSTOSCOPY/URETEROSCOPY/HOLMIUM LASER/STENT PLACEMENT
Anesthesia: General | Site: Abdomen | Laterality: Left

## 2022-01-23 MED ORDER — LIDOCAINE 2% (20 MG/ML) 5 ML SYRINGE
INTRAMUSCULAR | Status: DC | PRN
  Administered 2022-01-23: 100 mg via INTRAVENOUS

## 2022-01-23 MED ORDER — ACETAMINOPHEN 500 MG PO TABS
500.0000 mg | ORAL_TABLET | Freq: Four times a day (QID) | ORAL | Status: DC | PRN
  Administered 2022-01-23: 500 mg via ORAL

## 2022-01-23 MED ORDER — FENTANYL CITRATE (PF) 100 MCG/2ML IJ SOLN
INTRAMUSCULAR | Status: AC
  Filled 2022-01-23: qty 2

## 2022-01-23 MED ORDER — CELECOXIB 200 MG PO CAPS
ORAL_CAPSULE | ORAL | Status: AC
  Filled 2022-01-23: qty 1

## 2022-01-23 MED ORDER — PROPOFOL 10 MG/ML IV BOLUS
INTRAVENOUS | Status: AC
  Filled 2022-01-23: qty 20

## 2022-01-23 MED ORDER — CEFAZOLIN SODIUM-DEXTROSE 2-4 GM/100ML-% IV SOLN
2.0000 g | INTRAVENOUS | Status: AC
  Administered 2022-01-23: 2 g via INTRAVENOUS

## 2022-01-23 MED ORDER — ACETAMINOPHEN 500 MG PO TABS: 1000.0000 mg | ORAL_TABLET | Freq: Once | ORAL | Status: DC

## 2022-01-23 MED ORDER — PHENAZOPYRIDINE HCL 200 MG PO TABS: 200.0000 mg | ORAL_TABLET | Freq: Three times a day (TID) | ORAL | 1 refills | Status: AC | PRN

## 2022-01-23 MED ORDER — ONDANSETRON HCL 4 MG/2ML IJ SOLN
INTRAMUSCULAR | Status: DC | PRN
  Administered 2022-01-23: 4 mg via INTRAVENOUS

## 2022-01-23 MED ORDER — TRAMADOL HCL 50 MG PO TABS: 50.0000 mg | ORAL_TABLET | Freq: Four times a day (QID) | ORAL | 0 refills | Status: AC | PRN

## 2022-01-23 MED ORDER — SCOPOLAMINE 1 MG/3DAYS TD PT72
1.0000 | MEDICATED_PATCH | TRANSDERMAL | Status: DC
  Administered 2022-01-23: 1.5 mg via TRANSDERMAL

## 2022-01-23 MED ORDER — IOHEXOL 300 MG/ML  SOLN
INTRAMUSCULAR | Status: DC | PRN
  Administered 2022-01-23: 14 mL

## 2022-01-23 MED ORDER — OXYBUTYNIN CHLORIDE 5 MG PO TABS: 5.0000 mg | ORAL_TABLET | Freq: Three times a day (TID) | ORAL | 1 refills | Status: AC | PRN

## 2022-01-23 MED ORDER — FENTANYL CITRATE (PF) 100 MCG/2ML IJ SOLN
INTRAMUSCULAR | Status: DC | PRN
  Administered 2022-01-23: 50 ug via INTRAVENOUS
  Administered 2022-01-23: 25 ug via INTRAVENOUS
  Administered 2022-01-23: 100 ug via INTRAVENOUS
  Administered 2022-01-23: 50 ug via INTRAVENOUS
  Administered 2022-01-23: 25 ug via INTRAVENOUS
  Administered 2022-01-23: 50 ug via INTRAVENOUS

## 2022-01-23 MED ORDER — MELOXICAM 15 MG PO TABS: 15.0000 mg | ORAL_TABLET | Freq: Every day | ORAL | 1 refills | Status: AC

## 2022-01-23 MED ORDER — ACETAMINOPHEN 500 MG PO TABS
ORAL_TABLET | ORAL | Status: AC
  Filled 2022-01-23: qty 2

## 2022-01-23 MED ORDER — CELECOXIB 200 MG PO CAPS
200.0000 mg | ORAL_CAPSULE | Freq: Once | ORAL | Status: AC
  Administered 2022-01-23: 200 mg via ORAL

## 2022-01-23 MED ORDER — FENTANYL CITRATE (PF) 100 MCG/2ML IJ SOLN: 25.0000 ug | INTRAMUSCULAR | Status: DC | PRN

## 2022-01-23 MED ORDER — MIDAZOLAM HCL 5 MG/5ML IJ SOLN
INTRAMUSCULAR | Status: DC | PRN
Start: 2022-01-23 — End: 2022-01-23
  Administered 2022-01-23: 2 mg via INTRAVENOUS

## 2022-01-23 MED ORDER — DEXAMETHASONE SODIUM PHOSPHATE 10 MG/ML IJ SOLN
INTRAMUSCULAR | Status: AC
  Filled 2022-01-23: qty 1

## 2022-01-23 MED ORDER — AMISULPRIDE (ANTIEMETIC) 5 MG/2ML IV SOLN: 10.0000 mg | Freq: Once | INTRAVENOUS | Status: DC | PRN

## 2022-01-23 MED ORDER — LIDOCAINE HCL (PF) 2 % IJ SOLN
INTRAMUSCULAR | Status: AC
  Filled 2022-01-23: qty 5

## 2022-01-23 MED ORDER — SCOPOLAMINE 1 MG/3DAYS TD PT72
MEDICATED_PATCH | TRANSDERMAL | Status: AC
  Filled 2022-01-23: qty 1

## 2022-01-23 MED ORDER — NITROFURANTOIN MONOHYD MACRO 100 MG PO CAPS
100.0000 mg | ORAL_CAPSULE | Freq: Two times a day (BID) | ORAL | 0 refills | Status: AC
Start: 2022-01-23 — End: 2022-01-30

## 2022-01-23 MED ORDER — ONDANSETRON HCL 4 MG/2ML IJ SOLN
INTRAMUSCULAR | Status: AC
  Filled 2022-01-23: qty 2

## 2022-01-23 MED ORDER — DEXAMETHASONE SODIUM PHOSPHATE 4 MG/ML IJ SOLN
INTRAMUSCULAR | Status: DC | PRN
  Administered 2022-01-23: 10 mg via INTRAVENOUS

## 2022-01-23 MED ORDER — PROPOFOL 10 MG/ML IV BOLUS
INTRAVENOUS | Status: DC | PRN
  Administered 2022-01-23: 150 mg via INTRAVENOUS
  Administered 2022-01-23 (×2): 50 mg via INTRAVENOUS

## 2022-01-23 MED ORDER — LACTATED RINGERS IV SOLN: INTRAVENOUS | Status: DC

## 2022-01-23 MED ORDER — PROMETHAZINE HCL 25 MG/ML IJ SOLN: 6.2500 mg | INTRAMUSCULAR | Status: DC | PRN

## 2022-01-23 MED ORDER — CEFAZOLIN SODIUM-DEXTROSE 2-4 GM/100ML-% IV SOLN
INTRAVENOUS | Status: AC
  Filled 2022-01-23: qty 100

## 2022-01-23 MED ORDER — SODIUM CHLORIDE 0.9 % IR SOLN
Status: DC | PRN
  Administered 2022-01-23: 3000 mL

## 2022-01-23 MED ORDER — MIDAZOLAM HCL 2 MG/2ML IJ SOLN
INTRAMUSCULAR | Status: AC
  Filled 2022-01-23: qty 2

## 2022-01-23 MED ORDER — 0.9 % SODIUM CHLORIDE (POUR BTL) OPTIME
TOPICAL | Status: DC | PRN
  Administered 2022-01-23: 500 mL

## 2022-01-23 SURGICAL SUPPLY — 31 items
APL SKNCLS STERI-STRIP NONHPOA (GAUZE/BANDAGES/DRESSINGS) ×1
BAG DRAIN URO-CYSTO SKYTR STRL (DRAIN) ×2 IMPLANT
BAG DRN UROCATH (DRAIN) ×1
BASKET LASER NITINOL 1.9FR (BASKET) ×1 IMPLANT
BASKET ZERO TIP NITINOL 2.4FR (BASKET) IMPLANT
BENZOIN TINCTURE PRP APPL 2/3 (GAUZE/BANDAGES/DRESSINGS) ×1 IMPLANT
BSKT STON RTRVL 120 1.9FR (BASKET) ×1
BSKT STON RTRVL ZERO TP 2.4FR (BASKET)
CATH INTERMIT  6FR 70CM (CATHETERS) ×2 IMPLANT
CATH URETERAL DUAL LUMEN 10F (MISCELLANEOUS) ×1 IMPLANT
CLOTH BEACON ORANGE TIMEOUT ST (SAFETY) ×2 IMPLANT
COVER DOME SNAP 22 D (MISCELLANEOUS) ×2 IMPLANT
DRSG TEGADERM 2-3/8X2-3/4 SM (GAUZE/BANDAGES/DRESSINGS) ×1 IMPLANT
ELECT REM PT RETURN 9FT ADLT (ELECTROSURGICAL)
ELECTRODE REM PT RTRN 9FT ADLT (ELECTROSURGICAL) IMPLANT
FIBER LASER FLEXIVA 365 (UROLOGICAL SUPPLIES) IMPLANT
GLOVE SURG ENC MOIS LTX SZ7 (GLOVE) ×2 IMPLANT
GOWN STRL REUS W/TWL LRG LVL3 (GOWN DISPOSABLE) ×2 IMPLANT
GUIDEWIRE ANG ZIPWIRE 038X150 (WIRE) IMPLANT
GUIDEWIRE STR DUAL SENSOR (WIRE) IMPLANT
GUIDEWIRE ZIPWRE .038 STRAIGHT (WIRE) ×1 IMPLANT
IV NS IRRIG 3000ML ARTHROMATIC (IV SOLUTION) ×4 IMPLANT
KIT TURNOVER CYSTO (KITS) ×2 IMPLANT
MANIFOLD NEPTUNE II (INSTRUMENTS) ×2 IMPLANT
NS IRRIG 500ML POUR BTL (IV SOLUTION) ×3 IMPLANT
PACK CYSTO (CUSTOM PROCEDURE TRAY) ×2 IMPLANT
SHEATH URETERAL 12FRX35CM (MISCELLANEOUS) ×1 IMPLANT
TRACTIP FLEXIVA PULS ID 200XHI (Laser) IMPLANT
TRACTIP FLEXIVA PULSE ID 200 (Laser) ×2
TUBE CONNECTING 12X1/4 (SUCTIONS) IMPLANT
TUBING UROLOGY SET (TUBING) IMPLANT

## 2022-01-23 NOTE — Anesthesia Postprocedure Evaluation (Signed)
Anesthesia Post Note  Patient: Kaitlyn Jenkins  Procedure(s) Performed: LEFT URETEROSCOPY/HOLMIUM LASER/STENT PLACEMENT/RETROGRADE PYELOGRAM (Left: Abdomen)     Patient location during evaluation: PACU Anesthesia Type: General Level of consciousness: sedated Pain management: pain level controlled Vital Signs Assessment: post-procedure vital signs reviewed and stable Respiratory status: spontaneous breathing and respiratory function stable Cardiovascular status: stable Postop Assessment: no apparent nausea or vomiting Anesthetic complications: no   No notable events documented.  Last Vitals:  Vitals:   01/23/22 1100 01/23/22 1115  BP: (!) 141/61 138/66  Pulse: 82 78  Resp: 14 11  Temp:  36.7 C  SpO2: 97% 94%    Last Pain:  Vitals:   01/23/22 1115  TempSrc:   PainSc: 0-No pain                 Kazimir Hartnett DANIEL

## 2022-01-23 NOTE — Interval H&P Note (Signed)
History and Physical Interval Note:  01/23/2022 8:11 AM  Kaitlyn Jenkins  has presented today for surgery, with the diagnosis of LEFT URETERAL STONE.  The various methods of treatment have been discussed with the patient and family. After consideration of risks, benefits and other options for treatment, the patient has consented to  Procedure(s): LEFT URETEROSCOPY/HOLMIUM LASER/STENT PLACEMENT (Left) as a surgical intervention.  The patient's history has been reviewed, patient examined, no change in status, stable for surgery.  I have reviewed the patient's chart and labs.  Questions were answered to the patient's satisfaction.    Given the size of the stone, we discussed a high likelihood of requiring a staged ureteroscopy. [name] voiced [name]

## 2022-01-23 NOTE — Anesthesia Procedure Notes (Addendum)
Procedure Name: LMA Insertion Date/Time: 01/23/2022 8:58 AM Performed by: Justice Rocher, CRNA Pre-anesthesia Checklist: Patient identified, Emergency Drugs available, Suction available, Patient being monitored and Timeout performed Patient Re-evaluated:Patient Re-evaluated prior to induction Oxygen Delivery Method: Circle system utilized Preoxygenation: Pre-oxygenation with 100% oxygen Induction Type: IV induction Ventilation: Mask ventilation without difficulty LMA: LMA inserted LMA Size: 3.0 Number of attempts: 1 Airway Equipment and Method: Bite block Placement Confirmation: positive ETCO2, CO2 detector and breath sounds checked- equal and bilateral Tube secured with: Tape Dental Injury: Teeth and Oropharynx as per pre-operative assessment

## 2022-01-23 NOTE — Transfer of Care (Signed)
Immediate Anesthesia Transfer of Care Note  Patient: Kaitlyn Jenkins  Procedure(s) Performed: Procedure(s) (LRB): LEFT URETEROSCOPY/HOLMIUM LASER/STENT PLACEMENT/RETROGRADE PYELOGRAM (Left)  Patient Location: PACU  Anesthesia Type: General  Level of Consciousness: awake, sedated, patient cooperative and responds to stimulation  Airway & Oxygen Therapy: Patient Spontanous Breathing and Patient connected to [place] 02 and soft FM   Post-op Assessment: Report given to PACU RN, Post -op Vital signs reviewed and stable and Patient moving all extremities  Post vital signs: Reviewed and stable  Complications: No apparent anesthesia complications

## 2022-01-23 NOTE — Discharge Instructions (Addendum)
[facility] [phone] Post Ureteroscopy With or Without Stent Instructions  **Remove stent by pulling on string on Monday 2/6** Definitions:  Ureter: The duct that transports urine from the kidney to the bladder. Stent:   A plastic hollow tube that is placed into the ureter, from the kidney to the bladder to prevent the ureter from swelling shut.  GENERAL INSTRUCTIONS:  Despite the fact that no skin incisions were used, the area around the ureter and bladder is raw and irritated. The stent is a foreign body which will further irritate the bladder wall. This irritation is manifested by increased frequency of urination, both day and night, and by an increase in the urge to urinate. In some, the urge to urinate is present almost always. Sometimes the urge is strong enough that you may not be able to stop yourself from urinating. The only real cure is to remove the stent and then give time for the bladder wall to heal which can't be done until the danger of the ureter swelling shut has passed, which varies.  You may see some blood in your urine while the stent is in place and a few days afterwards. Do not be alarmed, even if the urine was clear for a while. Get off your feet and drink lots of fluids until clearing occurs. If you start to pass clots or don't improve, call us.  DIET: You may return to your normal diet immediately. Because of the raw surface of your bladder, alcohol, spicy foods, acid type foods and drinks with caffeine may cause irritation or frequency and should be used in moderation. To keep your urine flowing freely and to avoid constipation, drink plenty of fluids during the day ( 8-10 glasses ). Tip: Avoid cranberry juice because it is very acidic.  ACTIVITY: Your physical activity doesn't need to be restricted. However, if you are very active, you may see some blood in your urine. We suggest that you reduce your activity under these circumstances until the  bleeding has stopped.  BOWELS: It is important to keep your bowels regular during the postoperative period. Straining with bowel movements can cause bleeding. A bowel movement every other day is reasonable. Use a mild laxative if needed, such as Milk of Magnesia 2-3 tablespoons, or 2 Dulcolax tablets. Call if you continue to have problems. If you have been taking narcotics for pain, before, during or after your surgery, you may be constipated. Take a laxative if necessary.   MEDICATION: You should resume your pre-surgery medications unless told not to. In addition you will often be given an antibiotic to prevent infection and likely several as needed medications for stent related discomfort. These should be taken as prescribed until the bottles are finished unless you are having an unusual reaction to one of the drugs.  PROBLEMS YOU SHOULD REPORT TO [place]: Fevers over 100.5 Fahrenheit. Heavy bleeding, or clots ( See above notes about blood in urine ). Inability to urinate. Drug reactions ( hives, rash, nausea, vomiting, diarrhea ). Severe burning or pain with urination that is not improving.   Next dose of Tylenol/acetaminophen after 2:30 pm today if needed. No ibuprofen, Advil, Aleve, Motrin, ketorolac, meloxicam, naproxen, or other NSAIDS until after 2 pm today if needed.    Post Anesthesia Home Care Instructions  Activity: Get plenty of rest for the remainder of the day. A responsible individual must stay with you for 24 hours following the procedure.  For the next 24 hours, DO NOT: -Drive a  car -Paediatric nurse -Drink alcoholic beverages -Take any medication unless instructed by your physician -Make any legal decisions or sign important papers.  Meals: Start with liquid foods such as gelatin or soup. Progress to regular foods as tolerated. Avoid greasy, spicy, heavy foods. If nausea and/or vomiting occur, drink only clear liquids until the nausea and/or vomiting subsides. Call  your physician if vomiting continues.  Special Instructions/Symptoms: Your throat may feel dry or sore from the anesthesia or the breathing tube placed in your throat during surgery. If this causes discomfort, gargle with warm salt water. The discomfort should disappear within 24 hours.  If you had a scopolamine patch placed behind your ear for the management of post- operative nausea and/or vomiting:  1. The medication in the patch is effective for 72 hours, after which it should be removed.  Wrap patch in a tissue and discard in the trash. Wash hands thoroughly with soap and water. 2. You may remove the patch earlier than 72 hours if you experience unpleasant side effects which may include dry mouth, dizziness or visual disturbances. 3. Avoid touching the patch. Wash your hands with soap and water after contact with the patch.

## 2022-01-23 NOTE — Op Note (Signed)
Operative Note  Preoperative diagnosis:  1.  Left renal stone  Postoperative diagnosis: 1.  Left renal stone  Procedure(s): 1.  Left ureteroscopy with laser lithotripsy and basket extraction of stones 2. Cystoscopy  3. L retrograde pyelogram 4. L ureteral stent placement (6x24) 5. Fluoroscopy with intraoperative interpretation  Surgeon: Donald Pore, MD  Assistants:  None  Anesthesia:  General  Complications:  None  EBL:  Minimal  Specimens: 1. stones for stone analysis (to be done at [facility])  Drains/Catheters: 1.  6Fr x 24cm ureteral stent on tether string  Intraoperative findings:   Cystoscopy demonstrated unremarkable bladder Left Ureteroscopy demonstrated a large stone in the collecting system Successful stent placement.  Indication:  Kaitlyn Jenkins is a 61 y.o. female with a large left stone. A stent had previously been placed.  Description of procedure: After informed consent was obtained from the patient, the patient was identified and taken to the operating room and placed in the supine position.  General anesthesia was administered as well as perioperative IV antibiotics.  At the beginning of the case, a time-out was performed to properly identify the patient, the surgery to be performed, and the surgical site.  Sequential compression devices were applied to the lower extremities at the beginning of the case for DVT prophylaxis.  The patient was then placed in the dorsal lithotomy supine position, prepped and draped in sterile fashion.  Preliminary scout fluoroscopy revealed that there was a large calcification area at the collecting system, which corresponds to the stone found on the preoperative CT scan. We then passed the 21-French rigid cystoscope through the urethra and into the bladder under vision without any difficulty, noting a normal urethra without strictures.  A systematic evaluation of the bladder revealed no evidence of any suspicious  bladder lesions.  Ureteral orifices were in normal position.    The distal aspect of the ureteral stent was seen protruding from the left ureteral orifice.  We then used the alligator-tooth forceps and grasped the distal end of the ureteral stent and brought it out the urethral meatus while watching the proximal coil straighten out nicely on fluoroscopy. Through the ureteral stent, we then passed a 0.038 glide wire up to the level of the renal pelvis.  The ureteral stent was then removed, leaving the glide wire up the left ureter.  The cystoscope was withdrawn, and a dual lumen catheter was inserted over the glide wire into the distal ureter. A gentle retrograde pyelogram was performed, revealing a normal caliber ureter without any filling defects. There was no hydronephrosis of the collecting system. There was a filling defect in the collecting system corresponding to the stone. A 0.038 sensor wire was then passed up to the level of the renal pelvis and secured to the drape as a safety wire. The dual lumen was removed.  An 11/13Fr ureteral access sheath was carefully advanced up the ureter to the level of the UPJ over this wire under fluoroscopic guidance. The flexible ureteroscope was advanced into the collecting system via the access sheath. The collecting system was inspected. The calculus was identified. Using the 272 micron holmium laser fiber, the stone was fragmented completely. A 2.2 Fr zero tip basket was used to remove the fragments under visual guidance. These were sent for chemical analysis. With the ureteroscope in the kidney, a gentle pyelogram was performed to delineate the calyceal system and we evaluated the calyces systematically. We encountered no further stones. The rest of the stone fragments were  very tiny and these were  irrigated away gently. The calyces were re-inspected and there were no significant stone fragment residual.   We then withdrew the ureteroscope back down the ureter  along with the access sheath, noting no evidence of any stones along the course of the ureter.  Once the ureteroscope was removed, we then used the Glidewire under fluoroscopic guidance and passed up a 6-French x 24 cm double-pigtail ureteral stent up the ureter, making sure that the proximal and distal ends coiled within the kidney and bladder respectively.  Note that we left a tether string attached to the distal end of the ureteral stent and it exited the urethral meatus and was secured to the  mons with a tegaderm adhesive.  The cystoscope was then advanced back into the bladder under vision.  We were able to see the distal stent coiling nicely within the bladder.  The bladder was then emptied with irrigation solution.  The cystoscope was then removed.    The patient tolerated the procedure well and there was no complication. Patient was awoken from anesthesia and taken to the recovery room in stable condition. I was present and scrubbed for the entirety of the case.  Plan:  Patient will be discharged home and can remove her stent in 5 days   G. Donald Pore MD [facility]  Pager: [phone]

## 2022-01-25 ENCOUNTER — Encounter (HOSPITAL_BASED_OUTPATIENT_CLINIC_OR_DEPARTMENT_OTHER): Payer: Self-pay | Admitting: Urology

## 2022-09-28 IMAGING — US US PELVIS COMPLETE WITH TRANSVAGINAL
2 series · 13 of 25 positions shown · non-contrast
Comparison: Prior CT from 06/23/2013.

CLINICAL DATA: Initial evaluation for postmenopausal bleeding.



[Series 1: us pelvis complete with transvaginal · 0.22mm/px · 5 of 20 slices shown (1 of 2)]
[im 1/20]
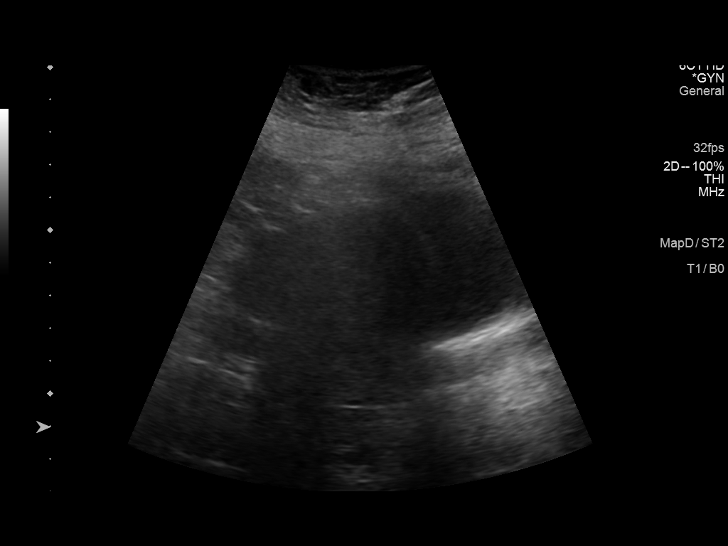
[im 5/20]
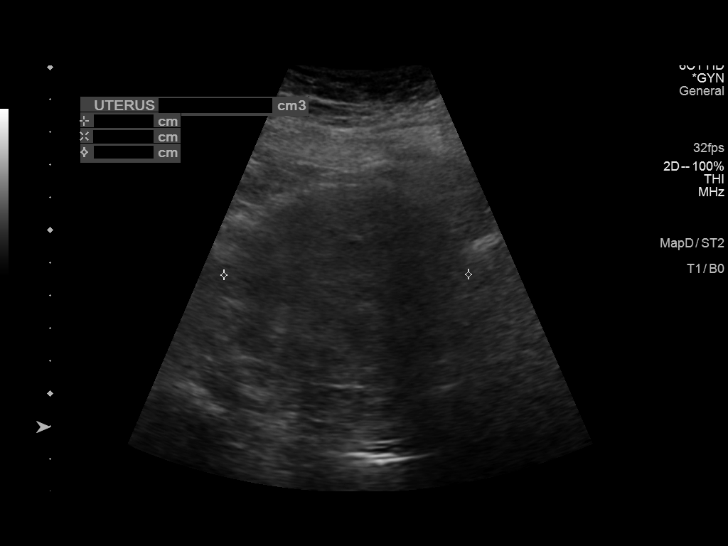
[im 10/20]
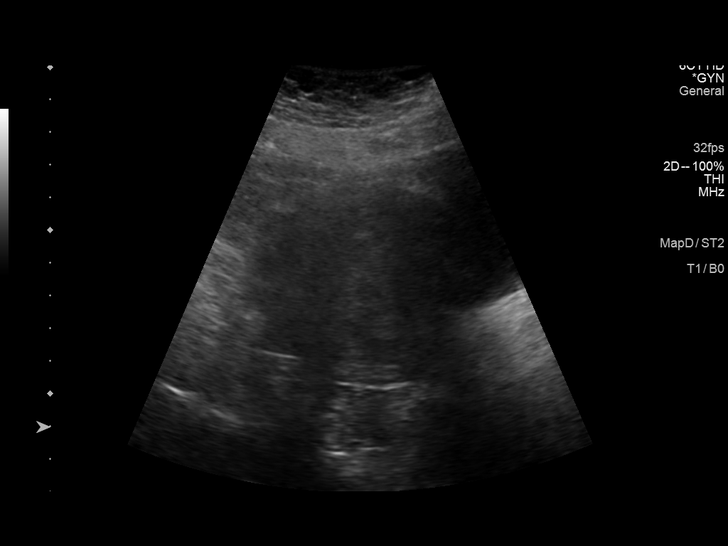
[im 15/20]
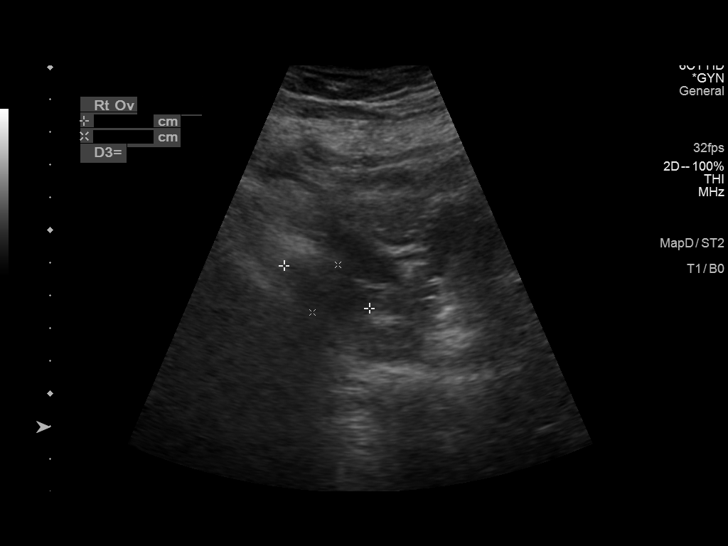
[im 20/20]
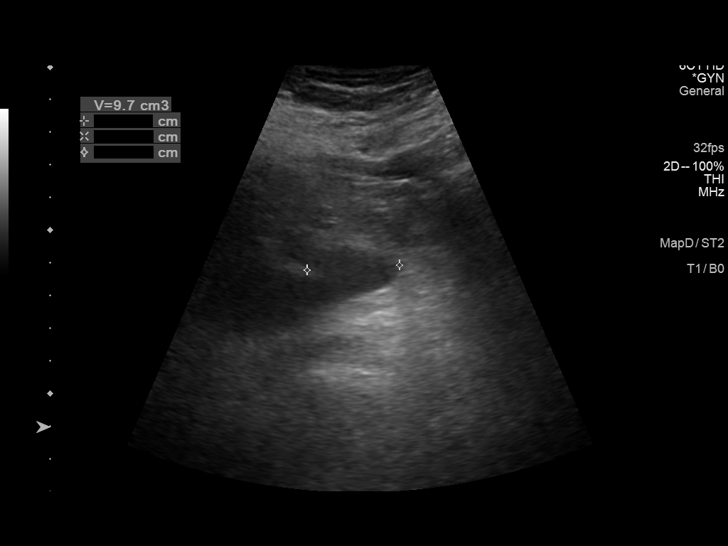

[Series 1001: us pelvis complete with transvaginal · 0.11mm/px · 8 of 35 slices shown (2 of 2)]
[im 3/35]
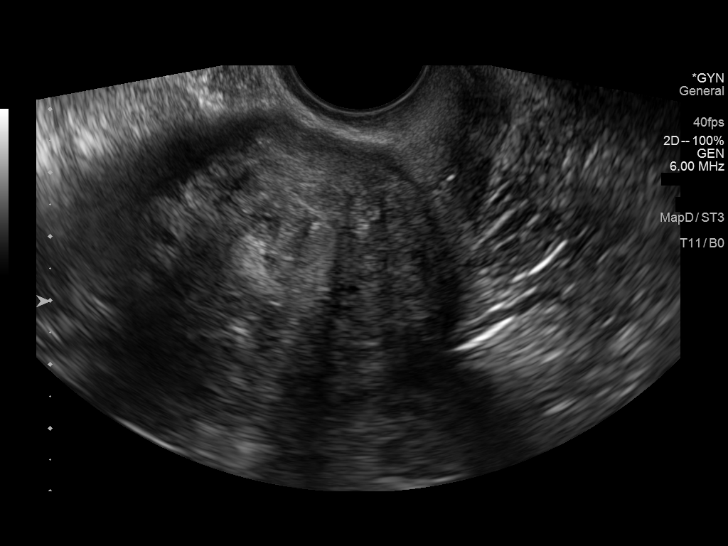
[im 7/35]
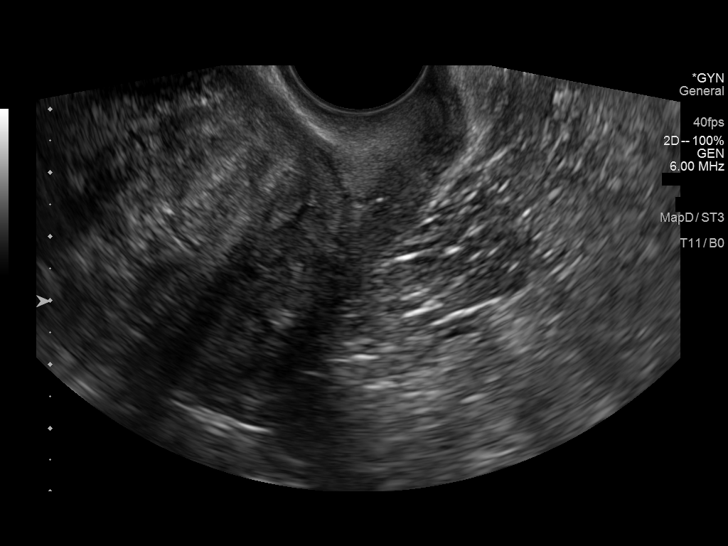
[im 12/35]
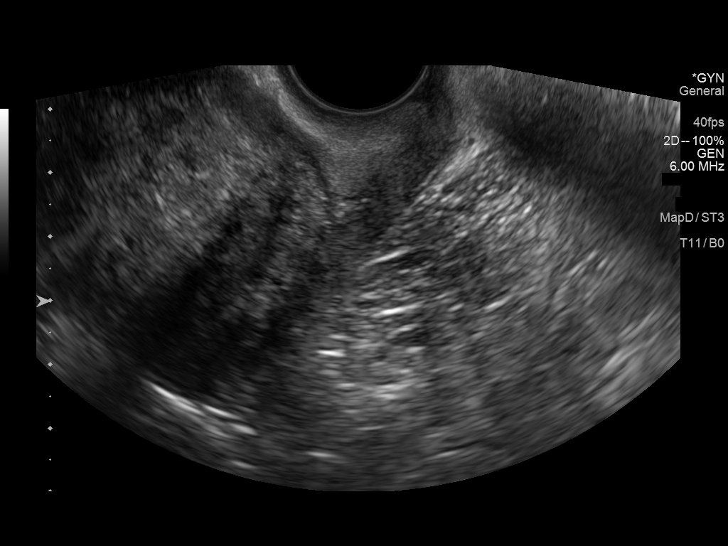
[im 16/35]
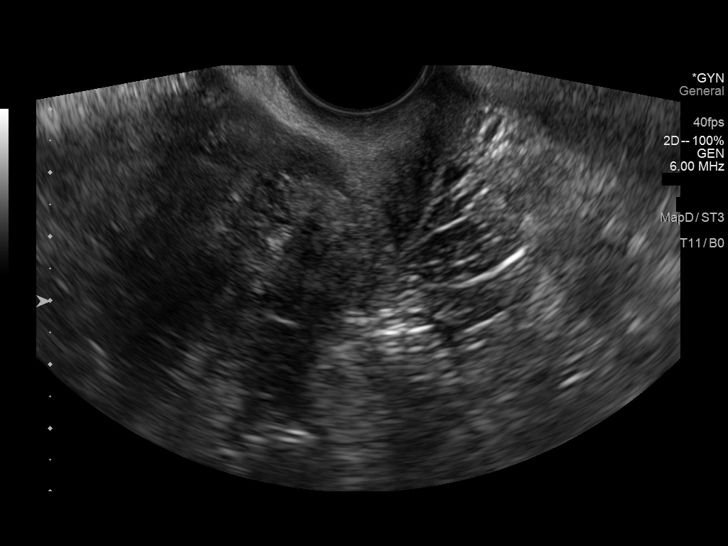
[im 21/35]
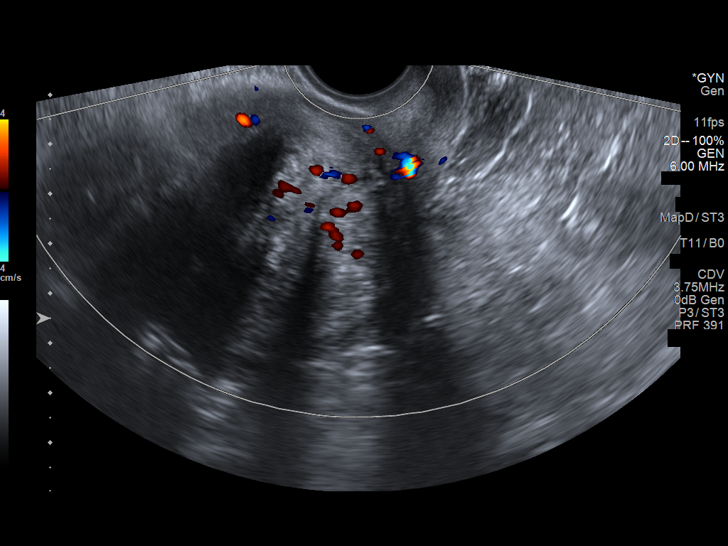
[im 25/35]
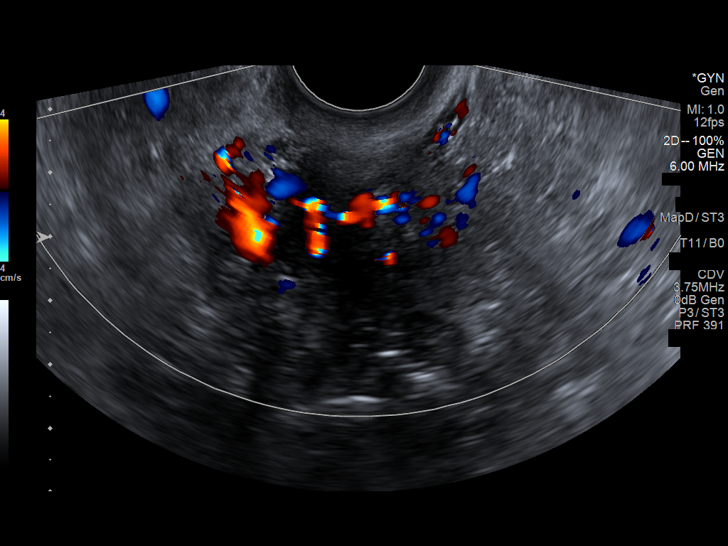
[im 30/35]
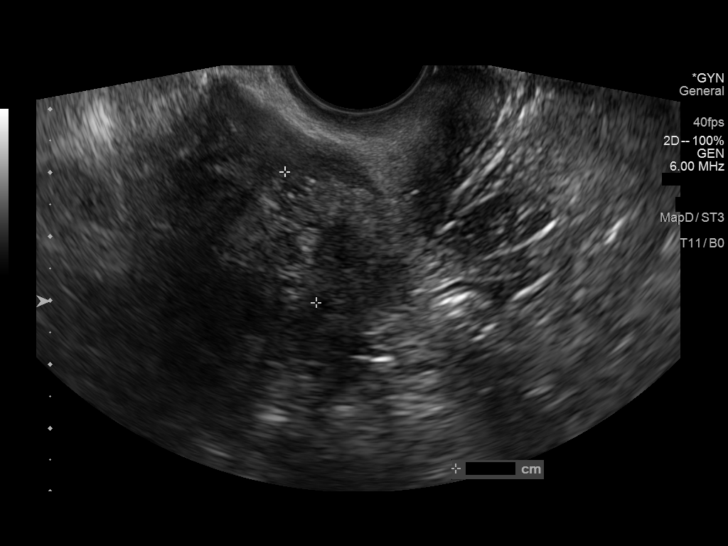
[im 35/35]
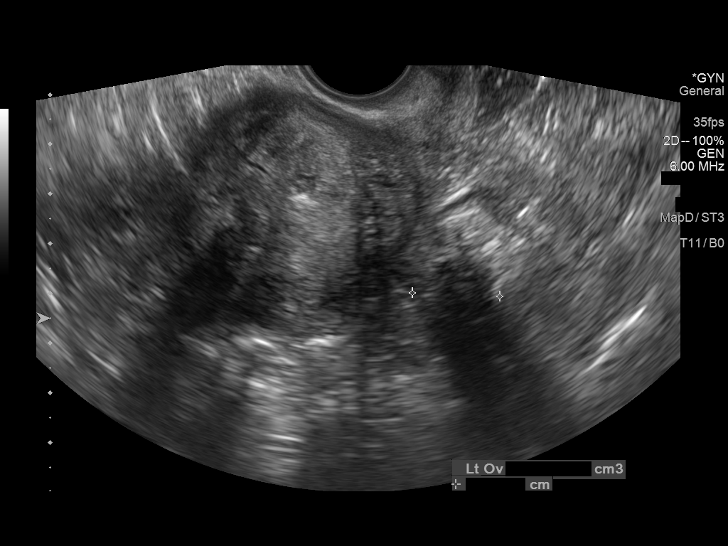

[13 of 25 positions shown; findings below may reference images not displayed]

FINDINGS: Uterus

Measurements: 6.6 x 5.8 x 7.5 cm = volume: 149.1 mL. Uterus is
anteverted. Large intramural fibroid positioned at the central
aspect of the uterine body/fundus measures 6.4 x 5.3 x 6.0 cm.

Endometrium

Endometrial complex difficult to visualize given overlying fibroid.
The visualized portions are measured to be thickened up to 21 mm by
the ultrasound technologist (image 28). No other focal abnormality.

Right ovary

Measurements: 2.9 x 1.7 x 2.8 cm = volume: 7.0 mL. Normal
appearance/no adnexal mass.

Left ovary

Measurements: 3.0 x 2.2 x 2.8 cm = volume: 9.7 mL. Normal
appearance/no adnexal mass.

Other findings

No abnormal free fluid.
IMPRESSION: 1. Apparent thickening of the endometrial complex up to 21 mm. In
the setting of post-menopausal bleeding, endometrial sampling is
indicated to exclude carcinoma. If results are benign,
sonohysterogram should be considered for focal lesion work-up. (Ref:
Radiological Reasoning: Algorithmic Workup of Abnormal Vaginal
Bleeding with Endovaginal Sonography and Sonohysterography. AJR
[id number]; 191:[id number]).
2. 6.4 cm intramural fibroid at the central aspect of the uterine
body/fundus.
3. Normal sonographic appearance of the ovaries. No adnexal mass or
free fluid.

## 2022-12-05 ENCOUNTER — Other Ambulatory Visit: Payer: Self-pay | Admitting: Internal Medicine

## 2022-12-05 DIAGNOSIS — F17200 Nicotine dependence, unspecified, uncomplicated: Secondary | ICD-10-CM

## 2022-12-05 DIAGNOSIS — Z122 Encounter for screening for malignant neoplasm of respiratory organs: Secondary | ICD-10-CM

## 2023-04-16 IMAGING — DX DG HAND COMPLETE 3+V*R*
3 series · 3 of 3 positions shown · non-contrast
Comparison: Right wrist series [DATE].

CLINICAL DATA: 60-year-old female status post fall on [REDACTED].
Continued pain swelling and bruising.

EXAM:
RIGHT HAND - COMPLETE 3+ VIEW

[hand pa]
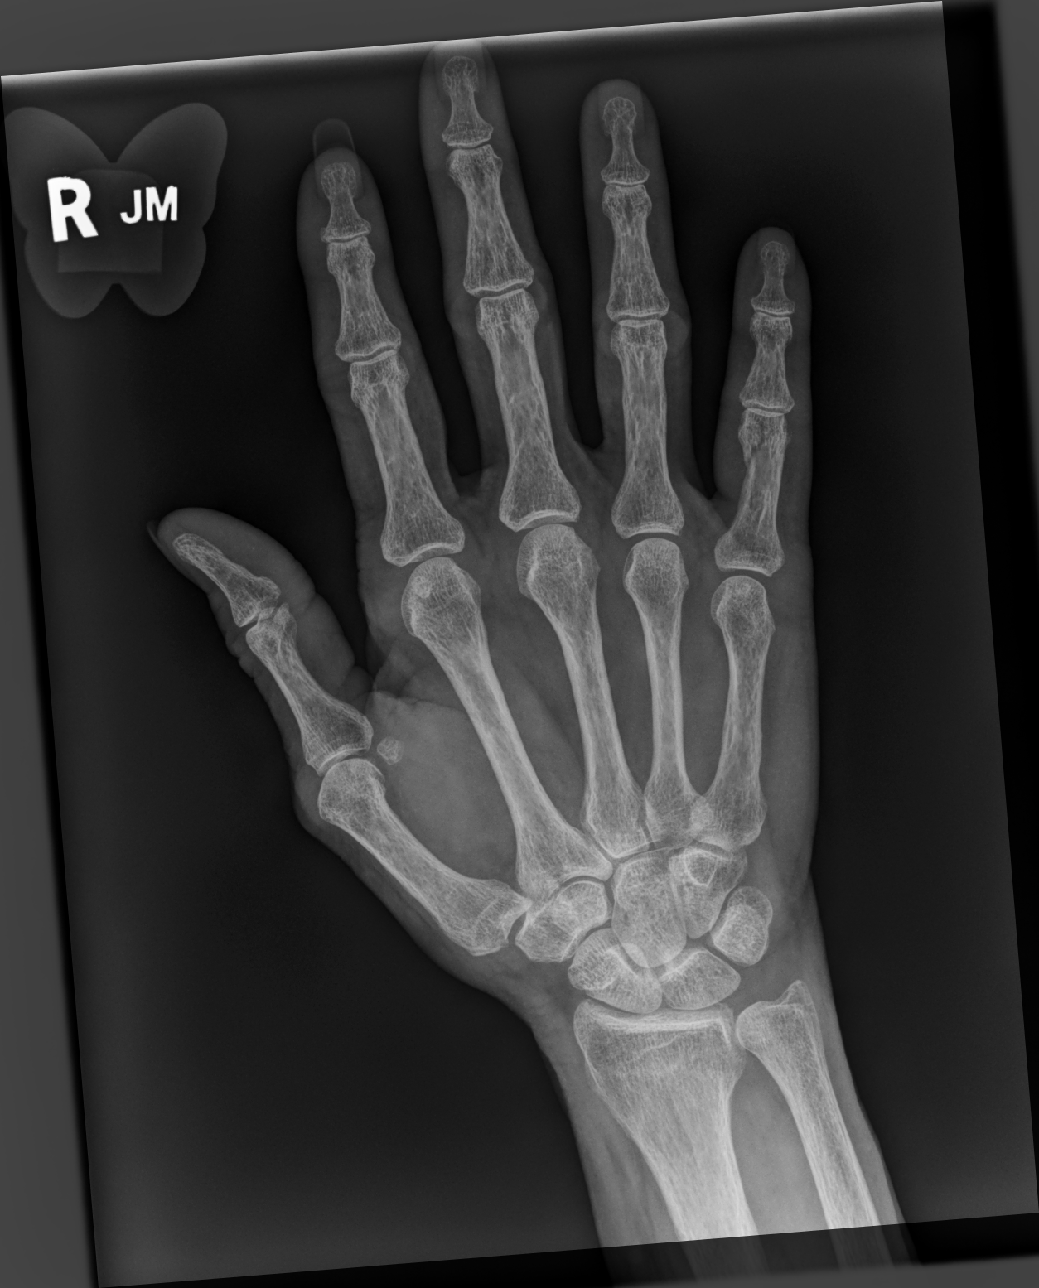

[hand mlo]
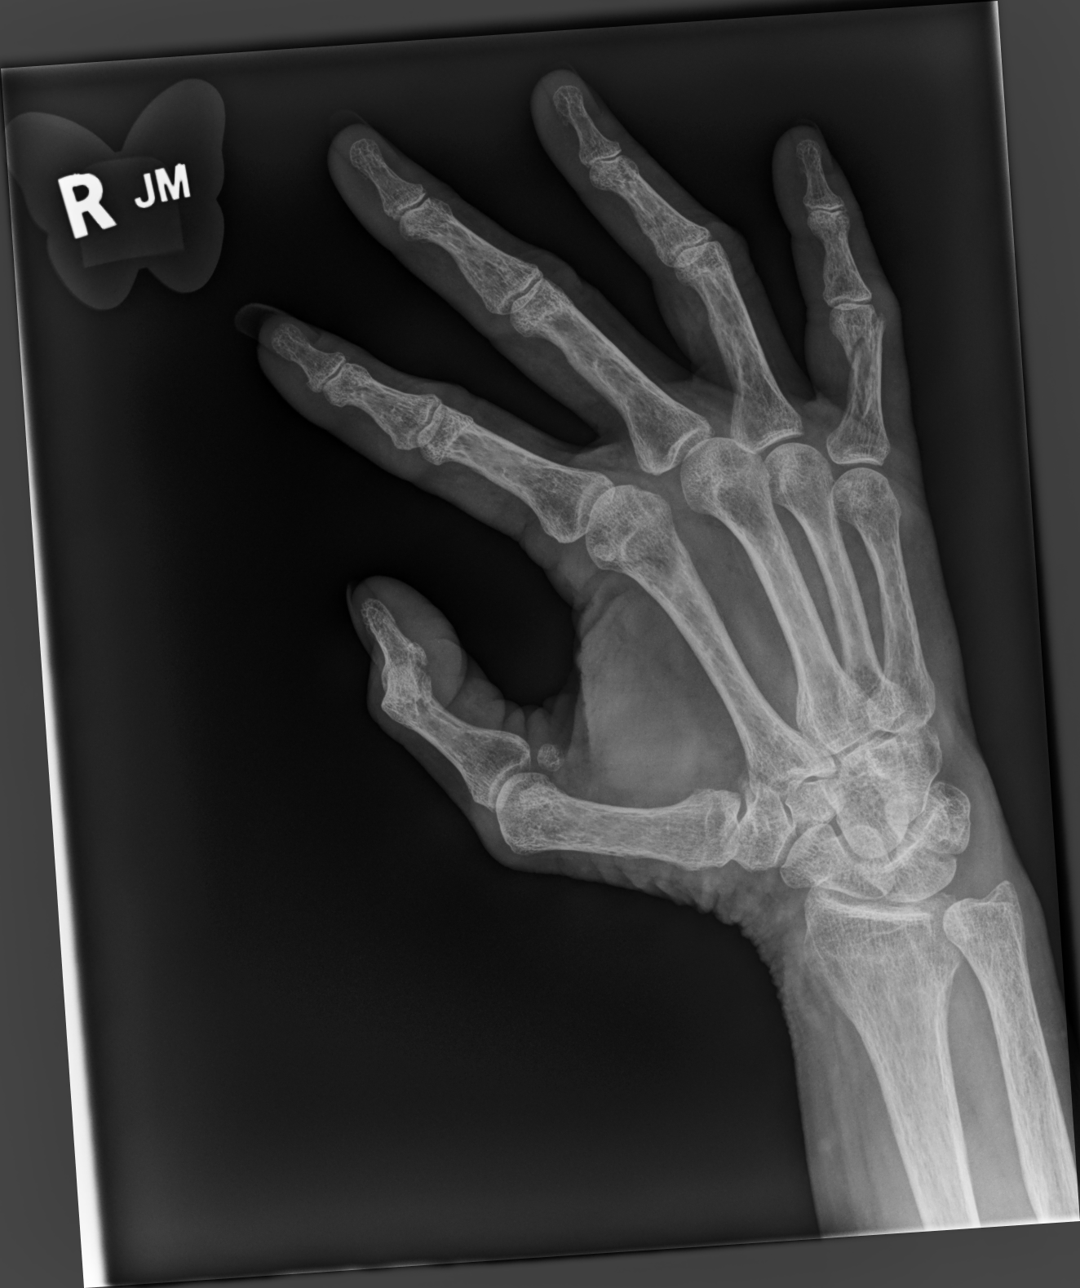

[hand lat]
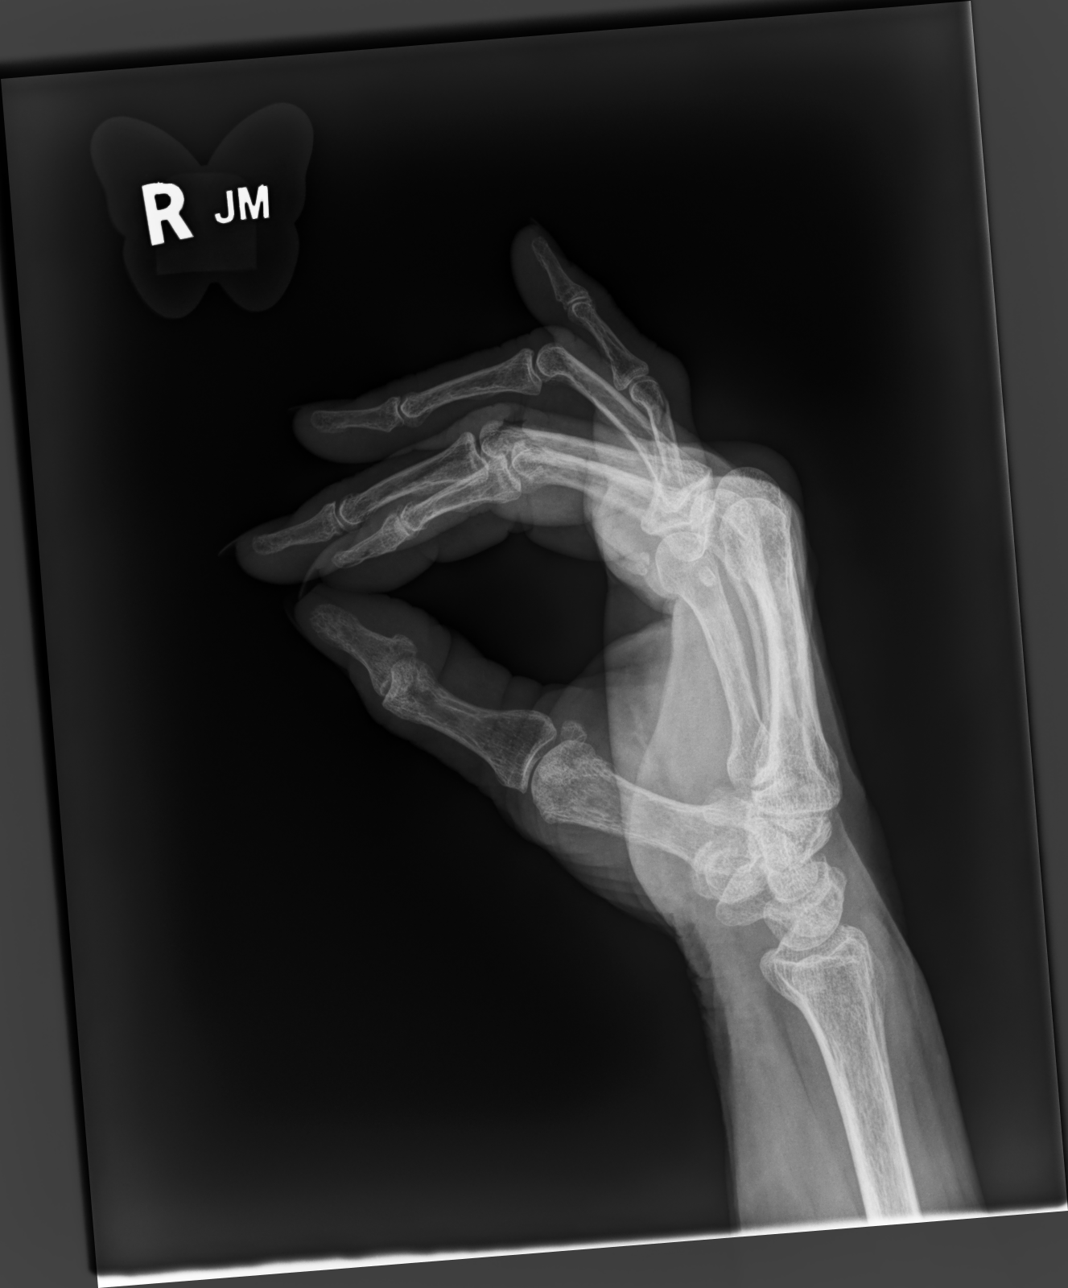

[3 of 3 positions shown; findings below may reference images not displayed]

FINDINGS: Bone mineralization is within normal limits for age.

There is an oblique fracture through the 5th proximal phalanx
extending from the distal 3rd through the ulnar head of the phalanx.
There does appear to be intra-articular involvement, but the 5th PIP
remains normally aligned.

Other visible osseous structures appear intact and normally aligned.
Normal joint spaces for age.
IMPRESSION: Oblique and intra-articular fracture of the 5th proximal phalanx.
The 5th PIP remains normally aligned.

## 2025-01-03 ENCOUNTER — Other Ambulatory Visit: Payer: Self-pay | Admitting: Orthopedic Surgery

## 2025-01-03 DIAGNOSIS — S32030D Wedge compression fracture of third lumbar vertebra, subsequent encounter for fracture with routine healing: Secondary | ICD-10-CM

## 2025-01-04 ENCOUNTER — Encounter: Payer: Self-pay | Admitting: Orthopedic Surgery

## 2025-01-15 ENCOUNTER — Ambulatory Visit
Admission: RE | Admit: 2025-01-15 | Discharge: 2025-01-15 | Disposition: A | Payer: Self-pay | Source: Ambulatory Visit | Attending: Orthopedic Surgery | Admitting: Orthopedic Surgery

## 2025-01-15 DIAGNOSIS — S32030D Wedge compression fracture of third lumbar vertebra, subsequent encounter for fracture with routine healing: Secondary | ICD-10-CM
